# Patient Record
Sex: Female | Born: 1960 | Race: White | Hispanic: No | Smoking: Current every day smoker
Health system: Southern US, Community
[De-identification: ages and names within clinical notes are randomized; demographics above are authoritative.]

## PROBLEM LIST (undated history)

## (undated) DIAGNOSIS — E785 Hyperlipidemia, unspecified: Secondary | ICD-10-CM

## (undated) DIAGNOSIS — I10 Essential (primary) hypertension: Secondary | ICD-10-CM

## (undated) HISTORY — DX: Essential (primary) hypertension: I10

## (undated) HISTORY — DX: Hyperlipidemia, unspecified: E78.5

## (undated) HISTORY — PX: SPINE SURGERY: SHX786

## (undated) HISTORY — PX: ANKLE SURGERY: SHX546

## (undated) HISTORY — PX: TONSILLECTOMY AND ADENOIDECTOMY: SHX28

---

## 2020-04-07 DIAGNOSIS — R55 Syncope and collapse: Secondary | ICD-10-CM

## 2020-04-07 HISTORY — DX: Syncope and collapse: R55

## 2020-05-08 HISTORY — PX: OTHER SURGICAL HISTORY: SHX169

## 2020-05-19 HISTORY — PX: TRANSTHORACIC ECHOCARDIOGRAM: SHX275

## 2020-09-03 ENCOUNTER — Encounter: Payer: Self-pay | Admitting: Cardiology

## 2020-09-03 ENCOUNTER — Other Ambulatory Visit: Payer: Self-pay

## 2020-09-03 ENCOUNTER — Ambulatory Visit (INDEPENDENT_AMBULATORY_CARE_PROVIDER_SITE_OTHER): Payer: BC Managed Care – PPO | Admitting: Cardiology

## 2020-09-03 VITALS — BP 110/62 | HR 102 | Ht 66.0 in | Wt 172.8 lb

## 2020-09-03 DIAGNOSIS — F17210 Nicotine dependence, cigarettes, uncomplicated: Secondary | ICD-10-CM

## 2020-09-03 DIAGNOSIS — Z87898 Personal history of other specified conditions: Secondary | ICD-10-CM

## 2020-09-03 DIAGNOSIS — Z8249 Family history of ischemic heart disease and other diseases of the circulatory system: Secondary | ICD-10-CM | POA: Diagnosis not present

## 2020-09-03 DIAGNOSIS — E785 Hyperlipidemia, unspecified: Secondary | ICD-10-CM | POA: Diagnosis not present

## 2020-09-03 DIAGNOSIS — I10 Essential (primary) hypertension: Secondary | ICD-10-CM | POA: Diagnosis not present

## 2020-09-03 DIAGNOSIS — R000 Tachycardia, unspecified: Secondary | ICD-10-CM | POA: Insufficient documentation

## 2020-09-03 NOTE — Patient Instructions (Addendum)
Medication Instructions:  No  changes  *If you need a refill on your cardiac medications before your next appointment, please call your pharmacy*   Lab Work: Okay to check at [place] office. Lipid panel  Comprehensive metabolic panel  TSH  Hemoglobin A1c  EKG 12-Lead    If you have labs (blood work) drawn today and your tests are completely normal, you will receive your results only by: MyChart Message (if you have MyChart) OR A paper copy in the mail If you have any lab test that is abnormal or we need to change your treatment, we will call you to review the results.   Testing/Procedures:CT coronary calcium score. This test is done at [facility]. This is $99 out of pocket.   Coronary CalciumScan A coronary calcium scan is an imaging test used to look for deposits of calcium and other fatty materials (plaques) in the inner lining of the blood vessels of the heart (coronary arteries). These deposits of calcium and plaques can partly clog and narrow the coronary arteries without producing any symptoms or warning signs. This puts a person at risk for a heart attack. This test can detect these deposits before symptoms develop. Tell a health care provider about: Any allergies you have. All medicines you are taking, including vitamins, herbs, eye drops, creams, and over-the-counter medicines. Any problems you or family members have had with anesthetic medicines. Any blood disorders you have. Any surgeries you have had. Any medical conditions you have. Whether you are pregnant or may be pregnant. What are the risks? Generally, this is a safe procedure. However, problems may occur, including: Harm to a pregnant woman and her unborn baby. This test involves the use of radiation. Radiation exposure can be dangerous to a pregnant woman and her unborn baby. If you are pregnant, you generally should not have this procedure done. Slight increase in the risk of cancer. This is  because of the radiation involved in the test. What happens before the procedure? No preparation is needed for this procedure. What happens during the procedure? You will undress and remove any jewelry around your neck or chest. You will put on a hospital gown. Sticky electrodes will be placed on your chest. The electrodes will be connected to an electrocardiogram (ECG) machine to record a tracing of the electrical activity of your heart. A CT scanner will take pictures of your heart. During this time, you will be asked to lie still and hold your breath for 2-3 seconds while a picture of your heart is being taken. The procedure may vary among health care providers and hospitals. What happens after the procedure? You can get dressed. You can return to your normal activities. It is up to you to get the results of your test. Ask your health care provider, or the department that is doing the test, when your results will be ready. Summary A coronary calcium scan is an imaging test used to look for deposits of calcium and other fatty materials (plaques) in the inner lining of the blood vessels of the heart (coronary arteries). Generally, this is a safe procedure. Tell your health care provider if you are pregnant or may be pregnant. No preparation is needed for this procedure. A CT scanner will take pictures of your heart. You can return to your normal activities after the scan is done. This information is not intended to replace advice given to you by your health care provider. Make sure you discuss any questions you have  with your health care provider. Document Released: 07/23/2007 Document Revised: 12/14/2015 Document Reviewed: 12/14/2015 Elsevier Interactive Patient Education  2017 [employer].    Follow-Up: At [facility], you and your health needs are our priority.  As part of our continuing mission to provide you with exceptional heart care, we have created designated Provider Care  Teams.  These Care Teams include your primary Cardiologist (physician) and Advanced Practice Providers (APPs -  Physician Assistants and Nurse Practitioners) who all work together to provide you with the care you need, when you need it.  We recommend signing up for the patient portal called "MyChart".  Sign up information is provided on this After Visit Summary.  MyChart is used to connect with patients for Virtual Visits (Telemedicine).  Patients are able to view lab/test results, encounter notes, upcoming appointments, etc.  Non-urgent messages can be sent to your provider as well.   To learn more about what you can do with MyChart, go to [email or web address].    Your next appointment:    1 to 2 month(s)  The format for your next appointment:   In person  or virtual   Provider:   Bryan Lemma, MD

## 2020-09-03 NOTE — Progress Notes (Signed)
Primary Care Provider: Pcp, No . Stambaugh, Jillyn Hidden, MD; [facility] [place]. Alford Highland, [place] Cardiologist: Glenetta Hew, MD Previously seen by: Lorella Nimrod, MD, [facility];  [facility]) Hypertrophic [place] [facility] Electrophysiologist: None  Clinic Note: No chief complaint on file.  ===================================  ASSESSMENT/PLAN   Problem List Items Addressed This Visit       Cardiology Problems   Essential hypertension (Chronic)    Blood pressure actually seems very well controlled today.  She is on an unusual dosing interval of 20 mg Benicar twice daily along with low-dose HCTZ 12.5 mg.  For now, I am inclined to leave meds alone pending results of her Coronary Calcium Score.  Eventually, I would consider reducing ARB dose to allow use of a beta-blocker (likely beta-1 selective) because of the sinus tachycardia.       Relevant Medications   hydrochlorothiazide (MICROZIDE) 12.5 MG capsule   olmesartan (BENICAR) 20 MG tablet   Hyperlipidemia with target LDL less than 100 (Chronic)    With hypertension and significant family history, I would suggest at least an LDL of less than 100.  She has not had labs checked since August 2021 which were poorly controlled.  Total cholesterol was 273 with an LDL of 193.  She is not currently on any medications.  Plan: Check FLP and CMP for new baseline Coronary Calcium Score for baseline cardiovascular risk -> this will help [place] determine how aggressive we need to be with management.      Relevant Medications   hydrochlorothiazide (MICROZIDE) 12.5 MG capsule   olmesartan (BENICAR) 20 MG tablet   Other Relevant Orders   Lipid panel   Comprehensive metabolic panel   Hemoglobin A1c     Other   Heavy cigarette smoker (Chronic)    I talked to her about the risk of smoking, very poorly controlled lipids along with hypertension and the patient and his family  history is pretty significant for CAD.  Both parents were smokers and both had MIs in their mid 48s to low 60s.  Smoking cessation instruction/counseling given:  counseled patient on the dangers of tobacco use, advised patient to stop smoking, and reviewed strategies to maximize success - 5 min       Relevant Orders   CT CARDIAC SCORING (SELF PAY ONLY)   EKG 12-Lead (Completed)   Sinus tachycardia (Chronic)    Heart rate is 102.  Not sure if this is simply because of anxiety.  She says her heart rate tends to run fast.  We will see what it is on her follow-up visit.  If it remains elevated, would potentially consider adding beta-blocker which would mean we need to reduce ARB dose.       Relevant Orders   CT CARDIAC SCORING (SELF PAY ONLY)   TSH   EKG 12-Lead (Completed)   History of syncope (Chronic)    She had a clear syncopal episode back in [place], but I am not sure if we may ever accurately diagnose what happened.  She was tachycardic in the ER, but unfortunately we do not have any documentation of what her rate and rhythm were at the time of her episode.  Heart rates in the 120s to 130s would not cause syncope, and did not sound consistent with SVT  I think if she has another episode, would consider loop recorder. I have not seen that but she has had a neurologic evaluation with EEG  Relevant Orders   TSH   EKG 12-Lead (Completed)   Family history of premature coronary artery disease - Primary (Chronic)    Both parents had CAD relatively early.  She herself has poorly controlled lipids based on August 2021 evaluation and has had hypertension.  She is a long-term smoker herself.  Relatively high risk.  Thankfully, she is asymptomatic with no active chest pain or pressure with rest or exertion.  No heart failure symptoms.  Plan: Risk Stratification with Coronary Calcium Score -> we will base our lipid management goals from this result. If significantly elevated, may  consider ischemic evaluation since she is relatively sedentary. Will go ahead and check a lipid panel as well as CBC, A1c and TSH for baseline evaluation.  (This can be checked in [place], in order for her to avoid 20+ minutes more driving.)       Relevant Orders   CT CARDIAC SCORING (SELF PAY ONLY)   Lipid panel   Comprehensive metabolic panel   Hemoglobin A1c   ===================================  HPI:    Armida Wixon is a 60 y.o. female with a PMH notable for hypertension and history of syncope who is being seen today for the evaluation of SYNCOPE, HYPERTENSION & FAMILY H/O PREMATURE CAD at the request of Bland Span, FNP.  Recent Hospitalizations:  04/30/2020 - [facility] for Fall/ Syncope - Facial & Rib pain Went to Urgent Care & sent to ER - HR noted to be ~120-130 bpm (? Rhythm).  Normal labs - despite Rib & facial Fxr - declined admission -> Left AMA CXR - ? Non-displaced 7th Rib Fxr.  Maxillofacial CT scan: revealed an acute fracture of the left orbital floor. An acute fracture of the lateral wall of the left orbit. Acute fractures of the anterior and posterior lateral walls of the left maxillary sinus. She was also noted to have bilateral maxillary sinusitis.  CT of the head was negative for acute intracranial hemorrhage  She noted that she woke up at 4 AM to go to the bathroom, and on her way back she apparently passed out.  Her boyfriend heard her fall and hit the floor-checked in on her and noted that she was not responsive.  She came back around after a few minutes but was still confused.  Took 10 to 15 minutes for her to become more like baseline.  No witnessed seizure-like activity (tonic-clonic movements, bladder incontinence or tongue biting). No associated irregular heartbeats or palpitations.  No dizziness or prior syncopal episodes.  No near syncope.  After the fall she went to bed but noted left sided jaw and rib pain from the fall.  Shadawn Tagliaferro was  seen by  on 05/07/2020 by Ms. Alford Highland following her ER visit for an episode of syncope.  The only thing she really complained about wasLeft-sided facial and rib pain.  She had not had any further episodes of lightheadedness dizziness or syncope. She ordered an echo and 48-hour Holter monitor -> See below She also ordered an EEG to rule out seizures, along with carotid Dopplers (these have not been done)  -She was seen in follow-up on April 14, noted to be hypertensive and was started on olmesartan. --> Apparently Kemya has been very upset about lack of responsiveness from either her primary provider cardiology managing hypertension.  She was calling about elevated pressures and felt like she was not getting a response.  However medications were adjusted based on telephone calls and she is now on  TID 20 mg losartan and HCTZ   Reviewed  CV studies:    The following studies were reviewed today: (if available, images/films reviewed: From Epic Chart or Care Everywhere) 48-hour Holter monitor April 2022: [facility]:  Sinus rhythm was the predominant rhythm. Heart rate ranges from 65 to 131 bpm average 84 bpm. The longest R-R interval was 1.1 seconds. Only rare (<0.01%) supraventricular ectopies recorded No ventricular Ectopics recorded. No pauses of 3 seconds or longer were recorded. No recorded atrial fibrillation or atrial flutter. Diary symptoms noted: None TTE 05/19/2020: ([facility], [place]) normal LVEF 60 to 65%.  No R WMA.  GR 1 DD with elevated LVEDP..  Mild MAC.  Otherwise normal valves.  Interval History:   Kabrina Pietrzak presents here today presumably to establish cardiology care as she "Does not trust " the cardiologist in her area.  She is not happy with how her blood pressure to manage.  She says that she is being very upset because her blood pressures were quite elevated.  She was monitoring her pressures at home with aBlood pressure log and had pressure ranges in  the 150-160/90s.  Despite having elevated blood pressures, she was not really having any symptoms of swelling, chest pain, dyspnea, PND or orthopnea.  No headache or dizziness. Really, since her syncopal episode in [place], she has not had any further episodes of dizziness, wooziness or acute syncope/near syncope.  She has not any orthostatic symptoms.    She feels as though her care providers have not been "attentive enough.  She is very concerned because of her family history of hypertension and relatively early age CAD.  She is in between the ages but her father and mother were when they suffered MIs.  Today she tells me that she is not very active, but does not note any resting exertional chest tightness or pressure.  No real noticeable change in her exertional dyspnea from smoking.  She is trying to now get a little more exercise trying to do some walking about 2 times a week.  Other than occasional twinging pain from her rib fracture, she does not noticed any chest tightness or pressure.  She has not had any further syncopal episodes.  She is never noted any rapid or irregular heartbeat/palpitations. She says her heart rate is always fast, but she does not really notice it.  She thinks maybe little anxious today so it is higher than usual.   CV Review of Symptoms (Summary) Cardiovascular ROS: positive for - dyspnea on exertion and some Chest wall pain from her rib fracture negative for - chest pain, edema, irregular heartbeat, orthopnea, palpitations, paroxysmal nocturnal dyspnea, rapid heart rate, or Further episodes of syncope/near syncope.  No TIA or amaurosis fugax.  Not walking enough to notice any claudication.  REVIEWED OF SYSTEMS   Review of Systems  Constitutional:  Negative for malaise/fatigue (Not very full of energy, but no fatigue.) and weight loss.  HENT:  Negative for congestion and nosebleeds.   Respiratory:  Positive for cough (chronic smoker's cough) and shortness of breath  (baseline).   Cardiovascular:  Negative for leg swelling.  Gastrointestinal:  Negative for blood in stool, constipation, diarrhea and melena.  Genitourinary:  Negative for dysuria and hematuria.  Musculoskeletal:  Positive for joint pain. Negative for falls.       Still has occasional chest wall pain related to Rib Fxr. Jaw seems to be doing better.  Neurological:  Negative for dizziness, focal weakness and weakness.  Psychiatric/Behavioral:  Negative for depression and memory loss. The patient is nervous/anxious. The patient does not have insomnia.    I have reviewed and (if needed) personally updated the patient's problem list, medications, allergies, past medical and surgical history, social and family history.   PAST MEDICAL HISTORY   Past Medical History:  Diagnosis Date   Essential hypertension    Hyperlipidemia    Syncopal episodes 04/2020   Normal echo and monitor.    PAST SURGICAL HISTORY   Past Surgical History:  Procedure Laterality Date   48-HOUR HOLTER MONITOR  05/2020   [facility]:  Sinus rhythm was the predominant rhythm. Heart rate ranges from 65 to 131 bpm average 84 bpm. The longest R-R interval was 1.1 seconds. Only rare (<0.01%) supraventricular ectopies recorded No ventricular Ectopics recorded. No pauses of 3 seconds or longer were recorded. No recorded atrial fibrillation or atrial flutter. Diary symptoms noted: None   ANKLE SURGERY     CESAREAN SECTION     Has had 4 C-sections   SPINE SURGERY     TONSILLECTOMY AND ADENOIDECTOMY     Childhood   TRANSTHORACIC ECHOCARDIOGRAM  05/19/2020   ([facility], [place]) normal LVEF 60 to 65%.  No R WMA.  GR 1 DD with elevated LVEDP..  Mild MAC.  Otherwise normal valves.    There is no immunization history on file for this patient.  MEDICATIONS/ALLERGIES   Current Meds  Medication Sig   ALPRAZolam (XANAX) 0.25 MG tablet Take 0.25 mg by mouth every 8 (eight) hours as needed.    hydrochlorothiazide (MICROZIDE) 12.5 MG capsule Take 12.5 mg by mouth daily.   olmesartan (BENICAR) 20 MG tablet Take 20 mg by mouth 2 (two) times daily.    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  She does travel somewhat work, but is able to work from home..   She is a divorced mother of 4.  (As of 2022: Ages 1, 31, 48 and 71. 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      She acknowledges that she does not do any routine exercise.      Family History  Problem Relation Age of Onset   Hypertension Mother    Heart attack Mother 81       Unknown details   Coronary artery disease Mother 16   Hypertension Father    Diabetes Mellitus II Father        Was morbidly obese   Heart attack Father 22       Presumably CAD, but not sure.   Coronary artery disease Father 63   Hypertension Sister    Hypertension Brother    Cancer Brother        Pediatric cancer  Lung cancer Maternal Grandmother    Heart disease Maternal Grandfather        Unknown details   Stroke Paternal Grandmother    Cancer - Colon Paternal Grandfather        Colorectal cancer   OBJCTIVE -PE, EKG, labs   Wt Readings from Last 3 Encounters:  09/03/20 172 lb 12.8 oz (78.4 kg)    Physical Exam: BP 110/62   Pulse (!) 102   Ht _0  (1.676 m)   Wt 172 lb 12.8  oz (78.4 kg)   SpO2 95%   BMI 27.89 kg/m  Physical Exam Vitals reviewed.  Constitutional:      General: She is not in acute distress.    Appearance: Normal appearance. She is normal weight. She is not ill-appearing (Well-nourished, well-groomed.) or toxic-appearing.     Comments: Cigarette smell.  HENT:     Head: Normocephalic and atraumatic.  Neck:     Vascular: No carotid bruit or JVD.  Cardiovascular:     Rate and Rhythm: Regular rhythm. Tachycardia present. No extrasystoles are present.    Chest Wall: PMI is not displaced.     Pulses: Decreased pulses (Mildly diminished pedal pulses.  1+).     Heart sounds: Normal heart sounds. No murmur heard.   No friction rub. No gallop.  Pulmonary:     Effort: Pulmonary effort is normal. No respiratory distress.     Comments: Mild interstitial sounds throughout.  But otherwise no rales or rhonchi.  Late expiratory wheezing. Chest:     Chest wall: Tenderness present.  Abdominal:     General: Abdomen is flat. Bowel sounds are normal. There is no distension.     Palpations: Abdomen is soft. There is no mass.     Tenderness: no abdominal tenderness     Comments: No HSM  Musculoskeletal:        General: No swelling. Normal range of motion.     Cervical back: Normal range of motion and neck supple.  Skin:    General: Skin is warm and dry.     Coloration: Skin is not pale.  Neurological:     General: No focal deficit present.     Mental Status: She is alert and oriented to person, place, and time.     Cranial Nerves: No cranial nerve deficit.     Gait: Gait normal.  Psychiatric:        Mood and Affect: Mood normal.        Thought Content: Thought content normal.        Judgment: Judgment normal.     Comments: Somewhat intense, anxious    Adult ECG Report  Rate: 102 ;  Rhythm: sinus tachycardia; otherwise normal axis, intervals and durations.  Narrative Interpretation: Borderline  Recent Labs: Reviewed in care everywhere [facility] Related to [facility] CMP LIPID Component 04/30/20  09/09/19   Sodium 139 140  Potassium 4.6 5.2 High   Chloride -- 103  CO2 -- 31  Urea Nitrogen -- 11  Creatinine 0.8 0.57 Low   Glom Filt Rate, Estimated 82 102   GFR, Estim African American -- 118   Glucose, Bld 98 103 High   Total Protein -- 6.6  Albumin -- 4.2  Calcium 9.4 9.9  Total Bilirubin 0.82 0.7  Alkaline Phosphatase, Serum 68 74  AST 25 14  ALT 26 19  Globulin 2.7 2.4  A/G Ratio -- 1.8  Anion Gap -- 11.2  Osmolality -- 279.05  Bun/Creatinine --  19.3  Triglyceride -- 215 High   Cholesterol -- 273 High   HDL -- 37 Low   LDL -- 193  LDL/HDL Ratio -- 5.2  Total Chol/HDL -- 7.4   Received From: [facility]  Result Received: 09/02/20 11:39   No results found for: CHOL, HDL, LDLCALC, LDLDIRECT, TRIG, CHOLHDL No results found for: CREATININE, BUN, NA, K, CL, CO2 No flowsheet data found.  No results found for: TSH  ==================================================  COVID-19 Education: The signs and symptoms of COVID-19 were discussed with the patient and how to seek care for testing (follow up with PCP or arrange E-visit).    I spent a total of 45 minutes with the patient spent in direct patient consultation.  --> She went through in detail all the episodes of [place] and then her concerns about blood pressure.  We talked about different evaluations we reviewed the results of her echocardiogram together as well as monitor.  I discussed other potential etiologies for syncope.  A good portion of this time was acknowledging her concerns about blood pressure management. I also explained the pathophysiology of atherosclerosis based on her family history of CAD.  She wanted baseline evaluation.  I explained what a coronary calcium score entails.  We also discussed when we would potentially consider stress testing.  Additional time spent with chart review  / charting (studies, outside notes, etc): 33 min -> Several  clinic visits, multiple studies reviewed. Entire medical/social and family history updated Total Time: 78 min  Current medicines are reviewed at length with the patient today.  (+/- concerns) n/a  This visit occurred during the SARS-CoV-2 public health emergency.  Safety protocols were in place, including screening questions prior to the visit, additional usage of staff PPE, and extensive cleaning of exam room while observing appropriate contact time as indicated for disinfecting solutions.  Notice: This dictation was prepared with Dragon dictation along with smaller phrase technology. Any transcriptional errors that result from this process are unintentional and may not be corrected upon review.  Patient Instructions / Medication Changes & Studies & Tests Ordered   Patient Instructions  Medication Instructions:  No  changes  *If you need a refill on your cardiac medications before your next appointment, please call your pharmacy*   Lab Work: Okay to check at [place] office. Lipid panel  Comprehensive metabolic panel  TSH  Hemoglobin A1c  EKG 12-Lead    If you have labs (blood work) drawn today and your tests are completely normal, you will receive your results only by: MyChart Message (if you have MyChart) OR A paper copy in the mail If you have any lab test that is abnormal or we need to change your treatment, we will call you to review the results.   Testing/Procedures:CT coronary calcium score. This test is done at [facility]. This is $99 out of pocket.   Coronary CalciumScan A coronary calcium scan is an imaging test used to look for deposits of calcium and other fatty materials (plaques) in the inner lining of the blood vessels of the heart (coronary arteries). These deposits of calcium and plaques can partly clog and narrow the coronary arteries without producing any symptoms or warning signs. This puts a person at risk for a heart attack. This test can detect  these deposits before symptoms develop. Tell a health care provider about: Any allergies you have. All medicines you are taking, including vitamins, herbs, eye drops, creams, and over-the-counter medicines. Any problems you or family members  have had with anesthetic medicines. Any blood disorders you have. Any surgeries you have had. Any medical conditions you have. Whether you are pregnant or may be pregnant. What are the risks? Generally, this is a safe procedure. However, problems may occur, including: Harm to a pregnant woman and her unborn baby. This test involves the use of radiation. Radiation exposure can be dangerous to a pregnant woman and her unborn baby. If you are pregnant, you generally should not have this procedure done. Slight increase in the risk of cancer. This is because of the radiation involved in the test. What happens before the procedure? No preparation is needed for this procedure. What happens during the procedure? You will undress and remove any jewelry around your neck or chest. You will put on a hospital gown. Sticky electrodes will be placed on your chest. The electrodes will be connected to an electrocardiogram (ECG) machine to record a tracing of the electrical activity of your heart. A CT scanner will take pictures of your heart. During this time, you will be asked to lie still and hold your breath for 2-3 seconds while a picture of your heart is being taken. The procedure may vary among health care providers and hospitals. What happens after the procedure? You can get dressed. You can return to your normal activities. It is up to you to get the results of your test. Ask your health care provider, or the department that is doing the test, when your results will be ready. Summary A coronary calcium scan is an imaging test used to look for deposits of calcium and other fatty materials (plaques) in the inner lining of the blood vessels of the heart (coronary  arteries). Generally, this is a safe procedure. Tell your health care provider if you are pregnant or may be pregnant. No preparation is needed for this procedure. A CT scanner will take pictures of your heart. You can return to your normal activities after the scan is done. This information is not intended to replace advice given to you by your health care provider. Make sure you discuss any questions you have with your health care provider. Document Released: 07/23/2007 Document Revised: 12/14/2015 Document Reviewed: 12/14/2015 Elsevier Interactive Patient Education  2017 [place]: At [facility], you and your health needs are our priority.  As part of our continuing mission to provide you with exceptional heart care, we have created designated Provider Care Teams.  These Care Teams include your primary Cardiologist (physician) and Advanced Practice Providers (APPs -  Physician Assistants and Nurse Practitioners) who all work together to provide you with the care you need, when you need it.  We recommend signing up for the patient portal called "MyChart".  Sign up information is provided on this After Visit Summary.  MyChart is used to connect with patients for Virtual Visits (Telemedicine).  Patients are able to view lab/test results, encounter notes, upcoming appointments, etc.  Non-urgent messages can be sent to your provider as well.   To learn more about what you can do with MyChart, go to [email or web address].    Your next appointment:    1 to 2 month(s)  The format for your next appointment:   In person  or virtual   Provider:   Glenetta Hew, MD       Studies Ordered:   Orders Placed This Encounter  Procedures   CT CARDIAC SCORING (SELF PAY ONLY)   Lipid panel   Comprehensive metabolic panel  TSH   Hemoglobin A1c   EKG 12-Lead      Glenetta Hew, M.D., M.S. Interventional Cardiologist   Pager # [phone] Phone # [phone] [address] [place], [place] [address]   Thank you for choosing Heartcare at [facility]!!

## 2020-09-04 NOTE — Telephone Encounter (Signed)
Can you see if there is something sooner in [place]?

## 2020-09-06 ENCOUNTER — Encounter: Payer: Self-pay | Admitting: Cardiology

## 2020-09-06 NOTE — Assessment & Plan Note (Signed)
Heart rate is 102.  Not sure if this is simply because of anxiety.  She says her heart rate tends to run fast.  We will see what it is on her follow-up visit.  If it remains elevated, would potentially consider adding beta-blocker which would mean we need to reduce ARB dose.

## 2020-09-06 NOTE — Assessment & Plan Note (Signed)
She had a clear syncopal episode back in [place], but I am not sure if we may ever accurately diagnose what happened.  She was tachycardic in the ER, but unfortunately we do not have any documentation of what her rate and rhythm were at the time of her episode.  Heart rates in the 120s to 130s would not cause syncope, and did not sound consistent with SVT  I think if she has another episode, would consider loop recorder. I have not seen that but she has had a neurologic evaluation with EEG

## 2020-09-06 NOTE — Assessment & Plan Note (Signed)
With hypertension and significant family history, I would suggest at least an LDL of less than 100.  She has not had labs checked since August 2021 which were poorly controlled.  Total cholesterol was 273 with an LDL of 193.  She is not currently on any medications.  Plan:  Check FLP and CMP for new baseline  Coronary Calcium Score for baseline cardiovascular risk -> this will help [place] determine how aggressive we need to be with management.

## 2020-09-06 NOTE — Assessment & Plan Note (Signed)
I talked to her about the risk of smoking, very poorly controlled lipids along with hypertension and the patient and his family history is pretty significant for CAD.  Both parents were smokers and both had MIs in their mid 55s to low 60s.  Smoking cessation instruction/counseling given:  counseled patient on the dangers of tobacco use, advised patient to stop smoking, and reviewed strategies to maximize success - 5 min

## 2020-09-06 NOTE — Assessment & Plan Note (Signed)
Both parents had CAD relatively early.  She herself has poorly controlled lipids based on August 2021 evaluation and has had hypertension.  She is a long-term smoker herself.  Relatively high risk.  Thankfully, she is asymptomatic with no active chest pain or pressure with rest or exertion.  No heart failure symptoms.  Plan:  Risk Stratification with Coronary Calcium Score -> we will base our lipid management goals from this result.  If significantly elevated, may consider ischemic evaluation since she is relatively sedentary.  Will go ahead and check a lipid panel as well as CBC, A1c and TSH for baseline evaluation.  (This can be checked in [place], in order for her to avoid 20+ minutes more driving.)

## 2020-09-06 NOTE — Assessment & Plan Note (Signed)
Blood pressure actually seems very well controlled today.  She is on an unusual dosing interval of 20 mg Benicar twice daily along with low-dose HCTZ 12.5 mg.  For now, I am inclined to leave meds alone pending results of her Coronary Calcium Score.  Eventually, I would consider reducing ARB dose to allow use of a beta-blocker (likely beta-1 selective) because of the sinus tachycardia.

## 2020-09-23 ENCOUNTER — Ambulatory Visit (INDEPENDENT_AMBULATORY_CARE_PROVIDER_SITE_OTHER)
Admission: RE | Admit: 2020-09-23 | Discharge: 2020-09-23 | Disposition: A | Discharge status: Home / Self Care | Payer: Self-pay | Source: Ambulatory Visit | Attending: Cardiology | Admitting: Cardiology

## 2020-09-23 ENCOUNTER — Other Ambulatory Visit: Payer: Self-pay

## 2020-09-23 DIAGNOSIS — R000 Tachycardia, unspecified: Secondary | ICD-10-CM

## 2020-09-23 DIAGNOSIS — Z8249 Family history of ischemic heart disease and other diseases of the circulatory system: Secondary | ICD-10-CM

## 2020-09-23 DIAGNOSIS — F17210 Nicotine dependence, cigarettes, uncomplicated: Secondary | ICD-10-CM

## 2020-09-23 DIAGNOSIS — I2510 Atherosclerotic heart disease of native coronary artery without angina pectoris: Secondary | ICD-10-CM

## 2020-09-23 HISTORY — DX: Atherosclerotic heart disease of native coronary artery without angina pectoris: I25.10

## 2020-09-24 LAB — LIPID PANEL
Chol/HDL Ratio: 8.1 ratio — ABNORMAL HIGH (ref 0.0–4.4)
Cholesterol, Total: 307 mg/dL — ABNORMAL HIGH (ref 100–199)
HDL: 38 mg/dL — ABNORMAL LOW (ref >39)
LDL Chol Calc (NIH): 229 mg/dL — ABNORMAL HIGH (ref 0–99)
Triglycerides: 203 mg/dL — ABNORMAL HIGH (ref 0–149)
VLDL Cholesterol Cal: 40 mg/dL (ref 5–40)

## 2020-09-24 LAB — COMPREHENSIVE METABOLIC PANEL
ALT: 17 IU/L (ref 0–32)
AST: 18 IU/L (ref 0–40)
Albumin/Globulin Ratio: 2.3 — ABNORMAL HIGH (ref 1.2–2.2)
Albumin: 4.8 g/dL (ref 3.8–4.9)
Alkaline Phosphatase: 78 IU/L (ref 44–121)
BUN/Creatinine Ratio: 22 (ref 9–23)
BUN: 15 mg/dL (ref 6–24)
Bilirubin Total: 0.6 mg/dL (ref 0.0–1.2)
CO2: 22 mmol/L (ref 20–29)
Calcium: 9.8 mg/dL (ref 8.7–10.2)
Chloride: 98 mmol/L (ref 96–106)
Creatinine, Ser: 0.69 mg/dL (ref 0.57–1.00)
Globulin, Total: 2.1 g/dL (ref 1.5–4.5)
Glucose: 111 mg/dL — ABNORMAL HIGH (ref 65–99)
Potassium: 4.5 mmol/L (ref 3.5–5.2)
Sodium: 139 mmol/L (ref 134–144)
Total Protein: 6.9 g/dL (ref 6.0–8.5)
eGFR: 100 mL/min/{1.73_m2} (ref >59)

## 2020-09-24 LAB — TSH: TSH: 1.57 u[IU]/mL (ref 0.450–4.500)

## 2020-09-24 LAB — HEMOGLOBIN A1C
Est. average glucose Bld gHb Est-mCnc: 123 mg/dL
Hgb A1c MFr Bld: 5.9 % — ABNORMAL HIGH (ref 4.8–5.6)

## 2020-09-25 ENCOUNTER — Telehealth: Payer: Self-pay | Admitting: *Deleted

## 2020-09-25 DIAGNOSIS — E785 Hyperlipidemia, unspecified: Secondary | ICD-10-CM

## 2020-09-25 DIAGNOSIS — R931 Abnormal findings on diagnostic imaging of heart and coronary circulation: Secondary | ICD-10-CM

## 2020-09-25 DIAGNOSIS — Z8249 Family history of ischemic heart disease and other diseases of the circulatory system: Secondary | ICD-10-CM

## 2020-09-25 DIAGNOSIS — F17210 Nicotine dependence, cigarettes, uncomplicated: Secondary | ICD-10-CM

## 2020-09-25 MED ORDER — ROSUVASTATIN CALCIUM 20 MG PO TABS: 20.0000 mg | ORAL_TABLET | Freq: Every day | ORAL | 3 refills | Status: DC

## 2020-09-25 NOTE — Telephone Encounter (Signed)
Order placed for rosuvastatin 20 mg  and lab work

## 2020-09-25 NOTE — Telephone Encounter (Signed)
-----   Message from Mihai Croitoru, MD sent at 09/24/2020 12:52 PM EDT ----- Cholesterol is very high (even higher than 2021) and needs to be treated with medication, in addition to healthy diet and exercise. LDL 229, target <70 (need a 70% reduction). Start rosuvastatin 20 mg daily please. Recheck lipids 3 months. Routine labs and thyroid are OK, glucose and A1c in prediabetes range. 

## 2020-09-25 NOTE — Telephone Encounter (Signed)
-----   Message from Thurmon Fair, MD sent at 09/24/2020 12:52 PM EDT ----- Cholesterol is very high (even higher than 2021) and needs to be treated with medication, in addition to healthy diet and exercise. LDL 229, target <70 (need a 70% reduction). Start rosuvastatin 20 mg daily please. Recheck lipids 3 months. Routine labs and thyroid are OK, glucose and A1c in prediabetes range.

## 2020-09-25 NOTE — Telephone Encounter (Signed)
Patient reviewed results of labs and cardiac scoring via mychart.  RN  called Patient to go over details with results. Patient is in agreement to start rosuvastatin  and schedule  exercise myoview.  Order placed

## 2020-09-25 NOTE — Telephone Encounter (Signed)
-----   Message from Thurmon Fair, MD sent at 09/23/2020 12:54 PM EDT ----- Calcium score is very high, suggesting increased likelihood of blockages. Important to keep cholesterol low (LDL<70, updated labs pending) and quit smoking permanently. Per August 2021 labs with LDL 193, needs >60% reduction in LDL. Recommend rosuvastatin 20 mg daily after we draw her updated labs. Also needs a functional evaluation. The high resting heart rate and the high calcium score could make coronary CTA harder to interpret. Recommend a treadmill Myoview if able to exercise (Lexiscan if not able).

## 2020-09-30 ENCOUNTER — Telehealth (HOSPITAL_COMMUNITY): Payer: Self-pay | Admitting: *Deleted

## 2020-09-30 NOTE — Telephone Encounter (Signed)
Close encounter 

## 2020-10-01 ENCOUNTER — Ambulatory Visit (HOSPITAL_COMMUNITY)
Admission: RE | Admit: 2020-10-01 | Discharge: 2020-10-01 | Disposition: A | Discharge status: Home / Self Care | Payer: BC Managed Care – PPO | Source: Ambulatory Visit | Attending: Cardiology | Admitting: Cardiology

## 2020-10-01 ENCOUNTER — Ambulatory Visit (HOSPITAL_COMMUNITY): Payer: BC Managed Care – PPO

## 2020-10-01 ENCOUNTER — Other Ambulatory Visit: Payer: Self-pay

## 2020-10-01 DIAGNOSIS — Z8249 Family history of ischemic heart disease and other diseases of the circulatory system: Secondary | ICD-10-CM | POA: Insufficient documentation

## 2020-10-01 DIAGNOSIS — R931 Abnormal findings on diagnostic imaging of heart and coronary circulation: Secondary | ICD-10-CM | POA: Insufficient documentation

## 2020-10-01 DIAGNOSIS — F17210 Nicotine dependence, cigarettes, uncomplicated: Secondary | ICD-10-CM | POA: Insufficient documentation

## 2020-10-01 DIAGNOSIS — Z79899 Other long term (current) drug therapy: Secondary | ICD-10-CM | POA: Diagnosis not present

## 2020-10-01 HISTORY — PX: NM MYOVIEW LTD: HXRAD82

## 2020-10-01 LAB — MYOCARDIAL PERFUSION IMAGING
Estimated workload: 6.4
Exercise duration (min): 5 min
Exercise duration (sec): 30 s
LV dias vol: 68 mL (ref 46–106)
LV sys vol: 27 mL
MPHR: 161 {beats}/min
Nuc Stress EF: 61 %
Peak HR: 162 {beats}/min
Percent HR: 100 %
Rest HR: 72 {beats}/min
Rest Nuclear Isotope Dose: 10.8 mCi
SDS: 1
SRS: 0
SSS: 1
ST Depression (mm): 0 mm
Stress Nuclear Isotope Dose: 31.1 mCi
TID: 1.07

## 2020-10-01 MED ORDER — TECHNETIUM TC 99M TETROFOSMIN IV KIT
31.1000 | PACK | Freq: Once | INTRAVENOUS | Status: AC | PRN
  Administered 2020-10-01: 31.1 via INTRAVENOUS
  Filled 2020-10-01: qty 32

## 2020-10-01 MED ORDER — TECHNETIUM TC 99M TETROFOSMIN IV KIT
10.8000 | PACK | Freq: Once | INTRAVENOUS | Status: AC | PRN
  Administered 2020-10-01: 10.8 via INTRAVENOUS
  Filled 2020-10-01: qty 11

## 2020-10-16 ENCOUNTER — Telehealth (INDEPENDENT_AMBULATORY_CARE_PROVIDER_SITE_OTHER): Payer: BC Managed Care – PPO | Admitting: Cardiology

## 2020-10-16 VITALS — BP 126/77 | HR 90 | Ht 66.0 in | Wt 170.0 lb

## 2020-10-16 DIAGNOSIS — E785 Hyperlipidemia, unspecified: Secondary | ICD-10-CM

## 2020-10-16 DIAGNOSIS — R000 Tachycardia, unspecified: Secondary | ICD-10-CM

## 2020-10-16 DIAGNOSIS — I2510 Atherosclerotic heart disease of native coronary artery without angina pectoris: Secondary | ICD-10-CM

## 2020-10-16 DIAGNOSIS — F17210 Nicotine dependence, cigarettes, uncomplicated: Secondary | ICD-10-CM

## 2020-10-16 DIAGNOSIS — I10 Essential (primary) hypertension: Secondary | ICD-10-CM

## 2020-10-16 DIAGNOSIS — Z8249 Family history of ischemic heart disease and other diseases of the circulatory system: Secondary | ICD-10-CM | POA: Diagnosis not present

## 2020-10-16 MED ORDER — ROSUVASTATIN CALCIUM 40 MG PO TABS: 40.0000 mg | ORAL_TABLET | Freq: Every day | ORAL | 3 refills | Status: DC

## 2020-10-16 MED ORDER — BUPROPION HCL ER (SR) 150 MG PO TB12: 150.0000 mg | ORAL_TABLET | Freq: Two times a day (BID) | ORAL | 6 refills | Status: DC

## 2020-10-16 MED ORDER — METOPROLOL TARTRATE 25 MG PO TABS: 25.0000 mg | ORAL_TABLET | Freq: Two times a day (BID) | ORAL | 3 refills | Status: DC

## 2020-10-16 NOTE — Patient Instructions (Addendum)
Medication Instructions:    Increase 40 mg Rosuvastatin  at bedtime  Stop taking  HCTZ ( hydrochlorothiazide)  Start taking Metoprolol tartrate 25 mg  one tablet twice a day   May use Wellbutrin 150 mg  twice a day  for smoking cessation ( do take the  second dose of the day late afternoon/early evening before 6 pm )   Once you are ready to quit smoking  you can use  Nicoderm  14 mg patch - may purchase over the continue.  *If you need a refill on your cardiac medications before your next appointment, please call your pharmacy*   Lab Work: Lipid  CMP -- use labslip that have been mailed  to you sometime in Dec /Jan 2023  If you have labs (blood work) drawn today and your tests are completely normal, you will receive your results only by: MyChart Message (if you have MyChart) OR A paper copy in the mail If you have any lab test that is abnormal or we need to change your treatment, we will call you to review the results.   Testing/Procedures:  Not needed   Follow-Up: At [facility], you and your health needs are our priority.  As part of our continuing mission to provide you with exceptional heart care, we have created designated Provider Care Teams.  These Care Teams include your primary Cardiologist (physician) and Advanced Practice Providers (APPs -  Physician Assistants and Nurse Practitioners) who all work together to provide you with the care you need, when you need it.     Your next appointment:   5 month(s)  The format for your next appointment:   In Person  Provider:   Bryan Lemma, MD   Other Instructions

## 2020-10-16 NOTE — Progress Notes (Signed)
Virtual Visit via Telephone Note   This visit type was conducted due to national recommendations for restrictions regarding the COVID-19 Pandemic (e.g. social distancing) in an effort to limit this patient's exposure and mitigate transmission in our community.  Due to her co-morbid illnesses, this patient is at least at moderate risk for complications without adequate follow up.  This format is felt to be most appropriate for this patient at this time.  The patient did not have access to video technology/had technical difficulties with video requiring transitioning to audio format only (telephone).  All issues noted in this document were discussed and addressed.  No physical exam could be performed with this format.  Please refer to the patient's chart for her  consent to telehealth for [facility].   Patient has given verbal permission to conduct this visit via virtual appointment and to bill insurance 10/18/2020 3:40 PM     Evaluation Performed:  Follow-up visit  Date:  10/18/2020   ID:  Jasmine Hatfield, DOB 1960/06/28, MRN [id number]  Patient Location: Home Provider Location: Office/Clinic  PCP:  Pcp, No ; Stambaugh, Merris Loni Muse, MD; Chauncey Reading. Alford Highland, [place] Cardiologist:  Glenetta Hew, MD  Electrophysiologist:  None   Chief Complaint:   Chief Complaint  Patient presents with   Follow-up    Test results: Coronary calcium score and Myoview   Coronary Artery Disease    Three-vessel disease noted on coronary CTA.  Mild exertional dyspnea but no angina.   Nicotine Dependence    Asking for help with smoking cessation.    ====================================  ASSESSMENT & PLAN:    Problem List Items Addressed This Visit       Cardiology Problems   Coronary artery disease, non-occlusive - Primary (Chronic)    Severe three-vessel calcification noted on coronary calcium score with a calcium score greater than 1600.  Thankfully, Myoview does not show any signs of ischemia and she is not  actively having any anginal symptoms.  Exertional dyspnea is clearly multifactorial with her long-term smoking, and generalized inactivity but also potentially microvascular ischemia.  Plan: Continue aspirin Increase rosuvastatin to 40 mg DC HCTZ in order to start metoprolol tartrate 25 mg twice daily Smoking cessation counseling-prescribing Wellbutrin 150 mg twice daily recommended 40 mg NicoDerm patch Plan to start Wellbutrin now and attempt smoking cessation as New Year's resolution.      Relevant Medications   rosuvastatin (CRESTOR) 40 MG tablet   metoprolol tartrate (LOPRESSOR) 25 MG tablet   Hyperlipidemia with target LDL less than 70 (Chronic)    Lipid panel looks very concerned with an LDL of 229.  I suspect they will need more than a statin to get this under control.  For now we will start with rosuvastatin 40 mg.  Recheck labs in ~3 months.  Low threshold for referral to Cardiometabolic/Lipid Clinic (As Part of Cardiovascular Risk Reduction Clinic-CVRR)  Plan: Increase rosuvastatin to 40 mg.      Relevant Medications   rosuvastatin (CRESTOR) 40 MG tablet   metoprolol tartrate (LOPRESSOR) 25 MG tablet   Essential hypertension (Chronic)    Blood pressure seems controlled, but she still has tachycardia.  With her having clear evidence of CAD, we need to have a beta-blocker on board.  Plan:   Start metoprolol tartrate 25 mg twice daily. Stop HCTZ and Continue current dose of ARB (Benicar 20 mg)      Relevant Medications   rosuvastatin (CRESTOR) 40 MG tablet   metoprolol tartrate (LOPRESSOR) 25 MG tablet  Other   Heavy cigarette smoker (Chronic)    With her significant CAD risk factors of hypertension, smoking, family history and very poorly controlled lipids, she really needs to stop smoking.  We actually spent about 10 minutes talking about smoking cessation.  She has not done well with Chantix in the past.  1 time at work, but the other time it did not help at  all. She is also reluctant in contacting a quit line-stating that when she tried it in [place], they only made things worse.  Plan: She wants to establish a quit date, but needs to wait till she gets back from her job assignment that takes her back to [place]. Quit date will be as a New Year's resolution Will initiate Wellbutrin 150 mg twice daily now and provide refills through mid year next year Recommend that she use 19m NicoDerm patches Additional counseling performed.      Sinus tachycardia (Chronic)    Heart rate a little better today.  Still tends to be in the 90s at least.  Plan: With known CAD now will initiate beta-blocker.   Start off with Lopressor 25 mg twice daily (and DC HCTZ to allow BP room). -=> We will hopefully titrate up dose until stable BP and HR.  Then convert to Toprol.      Family history of premature coronary artery disease (Chronic)    Documented three-vessel calcific CAD with coronary cath score 1667 but Myoview negative.  Low threshold for invasive evaluation virtually to have progressive symptoms of dyspnea with exertion or any exertional chest discomfort.  Now on aspirin, increased dose of statin, ARB and beta-blocker per GMDT       ====================================  History of Present Illness:    Jasmine Hatfield is a 60y.o. female with PMH notable for CRFs of HTN, HLD, Family History of Premature CAD with Elevated Coronary Calcium Score who presents via audio/video conferencing for a telehealth visit today as a follow-up visit to discuss results of Coronary Calcium Score along with Myoview.  Jasmine Hatfield was seen on September 03, 2020 for cardiology consultation ostensibly for syncope but also for family history of CAD.  She is a heavy smoker with significantly elevated lipid panel along with hypertension.  She had not had any further episodes of syncope since her episode back in [place].  This has been evaluated with a monitor that was relatively benign.   Echocardiogram was also relatively normal. -> She was very concerned because of her family history of CAD and her risk factors of hypertension and hyperlipidemia as well as smoking. => Coronary calcium score ordered for risk stratification which then done through Myoview being checked.  Hospitalizations:  No further episodes   Recent - Interim CV studies:   The following studies were reviewed today: CT Cardiac Scoring 09/23/2020: Significant Elevated Coronary Calcium Score 1667.  Noted severe Three-Vessel Coronary Artery Calcification.  Perfusion study recommended.  Also noted aortic atherosclerosis and a 5 mm groundglass nodule in left upper lobe.  (Nodule does not warrant follow-up) Myoview 10/01/2020: (Images personally reviewed).  EF 60 to 65%.  Normal perfusion with no evidence of Ischemia or Infarction.  LOW RISK  Inerval History   Jasmine Hatfield is being evaluated today via telephone call.  She was not able to make the drive down to the clinic to be seen in person and does not have ability to do video conferencing.  The main focus of this visit was to discuss the results of her 2  studies noted above.  She was very happy to hear that there was no evidence of ischemia.  She does have some exertional dyspnea (which she contributes to long-term smoking, deconditioning and being sedentary) but has not really had any chest pain or pressure.  She is still trying to get some more exercise being done, but work has been quite taxing.  She actually is about to travel back to [facility] and stay through December to work on important case.  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  She definitely wants to make some lifestyle changes, but says that because of the stress of work and her having to travel, she really wants to wait till she returns home.  She wants to make smoking cessation and New Year's resolution.  She just thinks that she probably would need some help.  In the  past she has tried Chantix but it did not seem to be effective.  She is quite anxious and wonders if something can be prescribed to help with her anxiety.  Cardiovascular ROS: positive for - dyspnea on exertion, rapid heart rate, and less frequent chest wall pain from her injury.  Smoker's cough negative for - edema, irregular heartbeat, orthopnea, palpitations, paroxysmal nocturnal dyspnea, or lightheadedness, dizziness or wooziness, syncope/near syncope or TIA/amaurosis fugax, claudication   ROS:  Please see the history of present illness.     Review of Systems  Constitutional:  Negative for malaise/fatigue (Does not have a lot of energy, but it has tried to pick up her exercise level which is helping) and weight loss.  HENT:  Negative for nosebleeds.   Respiratory:  Positive for cough (Chronic nonproductive smoker's cough), shortness of breath (Pretty much at baseline) and wheezing (Off and on, but not currently).   Cardiovascular:  Negative for chest pain (No longer really having the chest wall pain).  Gastrointestinal:  Negative for blood in stool and melena.  Genitourinary:  Negative for hematuria.  Musculoskeletal:  Positive for joint pain. Negative for falls and myalgias.  Neurological:  Positive for dizziness (Off-and-on, but no near syncope or syncope.).  Psychiatric/Behavioral:  Negative for depression and memory loss. The patient is nervous/anxious and has insomnia.    Past Medical History:  Diagnosis Date   Coronary artery disease due to calcified coronary lesion 09/23/2020   Coronary calcium score 1667 with severe three-vessel coronary calcification noted. ->  Myoview nonischemic with EF 60 to 65%.   Essential hypertension    Hyperlipidemia    Syncopal episodes 04/2020   Normal echo and monitor.   Past Surgical History:  Procedure Laterality Date   48-HOUR HOLTER MONITOR  05/2020   [facility]:  Sinus rhythm was the predominant rhythm. Heart rate ranges  from 65 to 131 bpm average 84 bpm. The longest R-R interval was 1.1 seconds. Only rare (<0.01%) supraventricular ectopies recorded No ventricular Ectopics recorded. No pauses of 3 seconds or longer were recorded. No recorded atrial fibrillation or atrial flutter. Diary symptoms noted: None   ANKLE SURGERY     CESAREAN SECTION     Has had 4 C-sections   NM MYOVIEW LTD  10/01/2020   EF 60 to 65%.  Normal perfusion with no evidence of Ischemia or Infarction.  LOW RISK   SPINE SURGERY     TONSILLECTOMY AND ADENOIDECTOMY     Childhood   TRANSTHORACIC ECHOCARDIOGRAM  05/19/2020   ([facility] [place]) normal LVEF 60 to 65%.  No R WMA.  GR 1 DD with elevated LVEDP..  Mild MAC.  Otherwise normal valves.     Current Meds  Medication Sig   ALPRAZolam (XANAX) 0.25 MG tablet Take 0.25 mg by mouth every 8 (eight) hours as needed. Takes when flying.   metoprolol tartrate (LOPRESSOR) 25 MG tablet Take 1 tablet (25 mg total) by mouth 2 (two) times daily.   olmesartan (BENICAR) 20 MG tablet Take 20 mg by mouth 2 (two) times daily.   rosuvastatin (CRESTOR) 20 MG tablet Take 1 tablet (20 mg total) by mouth daily.   hydrochlorothiazide (MICROZIDE) 12.5 MG capsule[DISCONTINUED today] ( Take 12.5 mg by mouth daily.     Allergies:   Patient has no known allergies.   Social History   Tobacco Use   Smoking status: Every Day    Packs/day: 1.00    Years: 40.00    Pack years: 40.00    Types: Cigarettes   Smokeless tobacco: Never  Substance Use Topics   Alcohol use: Yes    Alcohol/week: 2.0 standard drinks    Types: 2 Glasses of wine per week   Drug use: Never     Family Hx: The patient's family history includes Cancer in her brother; Cancer - Colon in her paternal grandfather; Coronary artery disease (age of onset: 33) in her mother; Coronary artery disease (age of onset: 47) in her father; Diabetes Mellitus II in her father; Heart attack (age of onset: 84) in her mother; Heart attack (age  of onset: 17) in her father; Heart disease in her maternal grandfather; Hypertension in her brother, father, mother, and sister; Lung cancer in her maternal grandmother; Stroke in her paternal grandmother.   Labs/Other Tests and Data Reviewed:    EKG:  No ECG reviewed.  Recent Labs: 09/23/2020: ALT 17; BUN 15; Creatinine, Ser 0.69; Potassium 4.5; Sodium 139; TSH 1.570   Recent Lipid Panel Lab Results  Component Value Date/Time   CHOL 307 (H) 09/23/2020 09:11 AM   TRIG 203 (H) 09/23/2020 09:11 AM   HDL 38 (L) 09/23/2020 09:11 AM   CHOLHDL 8.1 (H) 09/23/2020 09:11 AM   LDLCALC 229 (H) 09/23/2020 09:11 AM    Wt Readings from Last 3 Encounters:  10/16/20 170 lb (77.1 kg)  10/01/20 172 lb (78 kg)  09/03/20 172 lb 12.8 oz (78.4 kg)     Objective:    Vital Signs:  BP 126/77 (BP Location: Left Arm)   Pulse 90   Ht _0  (1.676 m)   Wt 170 lb (77.1 kg)   BMI 27.44 kg/m   VITAL SIGNS:  reviewed GEN:  no acute distress RESPIRATORY:  normal Respiratory effort, no wheezing  NEURO:  A& O x 3.   PSYCH:  normal mood & affect   ==========================================  COVID-19 Education: The signs and symptoms of COVID-19 were discussed with the patient and how to seek care for testing (follow up with PCP or arrange E-visit).   The importance of social distancing was discussed today.  Time:   Today, I have spent 21  minutes with the patient with telehealth technology discussing the above problems.   An additional 39mnutes spent charting (reviewing prior notes, hospital records, studies, labs etc.) Total 31 minutes   Medication Adjustments/Labs and Tests Ordered: Current medicines are reviewed at length with the patient today.  Concerns regarding medicines are outlined above.   Patient Instructions  Medication Instructions:    Increase 40 mg Rosuvastatin  at bedtime  Stop taking  HCTZ ( hydrochlorothiazide)  Start  taking Metoprolol tartrate 25 mg  one tablet twice a  day   May use Wellbutrin 150 mg  twice a day  for smoking cessation ( do take the  second dose of the day late afternoon/early evening before 6 pm )   Once you are ready to quit smoking  you can use  Nicoderm  14 mg patch - may purchase over the continue.  *If you need a refill on your cardiac medications before your next appointment, please call your pharmacy*   Lab Work: Lipid  CMP -- use labslip that have been mailed  to you sometime in Dec /Jan 2023  If you have labs (blood work) drawn today and your tests are completely normal, you will receive your results only by: [place] (if you have MyChart) OR A paper copy in the mail If you have any lab test that is abnormal or we need to change your treatment, we will call you to review the results.   Testing/Procedures:  Not needed   Follow-Up: At [facility], you and your health needs are our priority.  As part of our continuing mission to provide you with exceptional heart care, we have created designated Provider Care Teams.  These Care Teams include your primary Cardiologist (physician) and Advanced Practice Providers (APPs -  Physician Assistants and Nurse Practitioners) who all work together to provide you with the care you need, when you need it.     Your next appointment:   5 month(s)  The format for your next appointment:   In Person  Provider:   Glenetta Hew, MD   Other Instructions    Signed, Glenetta Hew, MD  10/18/2020 3:40 PM    [place]

## 2020-10-18 ENCOUNTER — Encounter: Payer: Self-pay | Admitting: Cardiology

## 2020-10-18 NOTE — Assessment & Plan Note (Signed)
Heart rate a little better today.  Still tends to be in the 90s at least.  Plan: With known CAD now will initiate beta-blocker.    Start off with Lopressor 25 mg twice daily (and DC HCTZ to allow BP room). -=> We will hopefully titrate up dose until stable BP and HR.  Then convert to Toprol.

## 2020-10-18 NOTE — Assessment & Plan Note (Signed)
Severe three-vessel calcification noted on coronary calcium score with a calcium score greater than 1600.  Thankfully, Myoview does not show any signs of ischemia and she is not actively having any anginal symptoms.  Exertional dyspnea is clearly multifactorial with her long-term smoking, and generalized inactivity but also potentially microvascular ischemia.  Plan:  Continue aspirin  Increase rosuvastatin to 40 mg  DC HCTZ in order to start metoprolol tartrate 25 mg twice daily  Smoking cessation counseling-prescribing Wellbutrin 150 mg twice daily recommended 40 mg NicoDerm patch  Plan to start Wellbutrin now and attempt smoking cessation as New Year's resolution.

## 2020-10-18 NOTE — Assessment & Plan Note (Signed)
With her significant CAD risk factors of hypertension, smoking, family history and very poorly controlled lipids, she really needs to stop smoking.  We actually spent about 10 minutes talking about smoking cessation.  She has not done well with Chantix in the past.  1 time at work, but the other time it did not help at all. She is also reluctant in contacting a quit line-stating that when she tried it in [place], they only made things worse.  Plan: She wants to establish a quit date, but needs to wait till she gets back from her job assignment that takes her back to [place].  Quit date will be as a New Year's resolution  Will initiate Wellbutrin 150 mg twice daily now and provide refills through mid year next year  Recommend that she use 14mg  NicoDerm patches  Additional counseling performed.

## 2020-10-18 NOTE — Assessment & Plan Note (Signed)
Documented three-vessel calcific CAD with coronary cath score 1667 but Myoview negative.  Low threshold for invasive evaluation virtually to have progressive symptoms of dyspnea with exertion or any exertional chest discomfort.  Now on aspirin, increased dose of statin, ARB and beta-blocker per GMDT

## 2020-10-18 NOTE — Assessment & Plan Note (Signed)
Lipid panel looks very concerned with an LDL of 229.  I suspect they will need more than a statin to get this under control.  For now we will start with rosuvastatin 40 mg.  Recheck labs in ~3 months.  Low threshold for referral to Cardiometabolic/Lipid Clinic (As Part of Cardiovascular Risk Reduction Clinic-CVRR)  Plan: Increase rosuvastatin to 40 mg.

## 2020-10-18 NOTE — Assessment & Plan Note (Addendum)
Blood pressure seems controlled, but she still has tachycardia.  With her having clear evidence of CAD, we need to have a beta-blocker on board.  Plan:    Start metoprolol tartrate 25 mg twice daily.  Stop HCTZ and  Continue current dose of ARB (Benicar 20 mg)

## 2020-10-29 NOTE — Telephone Encounter (Signed)
Mychart message sent  Good Afternoon,   Do you have a list of your blood pressure reading from each day as well heart rate? I know you have listed a range of the blood pressure.  Once we receive the readings, the information will be sent to Dr Herbie Baltimore to see if would like to increase or add a new medication.  You may take some acetaminophen if need for headache Jasmine Hatfield

## 2020-10-30 NOTE — Telephone Encounter (Signed)
I am not sure which is causing which.  I think maybe the headaches may be causing some distress which increases her blood pressure.  These do not usually blood pressures that would make you have a headache.  I am a little bit concerned about increasing her Benicar to 40 mg if you are taking a lot of ibuprofen. For now, if pressures are elevated (above 150 mmHg), take additional dose of metoprolol 25 mg.  Bryan Lemma, MD

## 2020-11-02 NOTE — Telephone Encounter (Signed)
I takes about 3-5 doses to really notice the full effect.  Bryan Lemma, MD

## 2020-11-06 MED ORDER — METOPROLOL TARTRATE 50 MG PO TABS: 50.0000 mg | ORAL_TABLET | Freq: Two times a day (BID) | ORAL | 3 refills | Status: DC

## 2020-11-06 MED ORDER — HYDROCHLOROTHIAZIDE 12.5 MG PO CAPS: 12.5000 mg | ORAL_CAPSULE | Freq: Every day | ORAL | 3 refills | Status: DC

## 2020-11-06 MED ORDER — OLMESARTAN MEDOXOMIL 20 MG PO TABS: 20.0000 mg | ORAL_TABLET | Freq: Two times a day (BID) | ORAL | 3 refills | Status: DC

## 2020-11-06 NOTE — Addendum Note (Signed)
Addended by: Freddi Starr on: 11/06/2020 01:40 PM   Modules accepted: Orders

## 2020-11-06 NOTE — Addendum Note (Signed)
Addended by: Parke Poisson on: 11/06/2020 01:12 PM   Modules accepted: Orders

## 2020-11-06 NOTE — Telephone Encounter (Signed)
Yes, can restart HCTZ, and lets increase the metoprolol to 2 tablets twice a day (50 mg twice daily)  Bryan Lemma, MD  We can change the prescription and follow-up bottles of metoprolol to 50 mg twice daily

## 2021-01-07 ENCOUNTER — Encounter: Payer: Self-pay | Admitting: Cardiology

## 2021-01-08 NOTE — Telephone Encounter (Signed)
I think anytime this month, ordered in January of this year.  Would be fasting lipid panel and CMP.  Bryan Lemma, MD

## 2021-01-12 ENCOUNTER — Other Ambulatory Visit: Payer: Self-pay | Admitting: *Deleted

## 2021-01-12 DIAGNOSIS — R931 Abnormal findings on diagnostic imaging of heart and coronary circulation: Secondary | ICD-10-CM

## 2021-01-12 DIAGNOSIS — E785 Hyperlipidemia, unspecified: Secondary | ICD-10-CM

## 2021-01-12 NOTE — Telephone Encounter (Signed)
Released labs  so they will be visible to [facility]

## 2021-01-19 LAB — COMPREHENSIVE METABOLIC PANEL
ALT: 39 IU/L — ABNORMAL HIGH (ref 0–32)
AST: 25 IU/L (ref 0–40)
Albumin/Globulin Ratio: 2.1 (ref 1.2–2.2)
Albumin: 4.4 g/dL (ref 3.8–4.9)
Alkaline Phosphatase: 71 IU/L (ref 44–121)
BUN/Creatinine Ratio: 32 — ABNORMAL HIGH (ref 9–23)
BUN: 21 mg/dL (ref 6–24)
Bilirubin Total: 0.5 mg/dL (ref 0.0–1.2)
CO2: 24 mmol/L (ref 20–29)
Calcium: 9.5 mg/dL (ref 8.7–10.2)
Chloride: 103 mmol/L (ref 96–106)
Creatinine, Ser: 0.66 mg/dL (ref 0.57–1.00)
Globulin, Total: 2.1 g/dL (ref 1.5–4.5)
Glucose: 114 mg/dL — ABNORMAL HIGH (ref 70–99)
Potassium: 4.4 mmol/L (ref 3.5–5.2)
Sodium: 141 mmol/L (ref 134–144)
Total Protein: 6.5 g/dL (ref 6.0–8.5)
eGFR: 101 mL/min/{1.73_m2} (ref >59)

## 2021-01-19 LAB — LIPID PANEL
Chol/HDL Ratio: 4 ratio (ref 0.0–4.4)
Cholesterol, Total: 135 mg/dL (ref 100–199)
HDL: 34 mg/dL — ABNORMAL LOW (ref >39)
LDL Chol Calc (NIH): 77 mg/dL (ref 0–99)
Triglycerides: 133 mg/dL (ref 0–149)
VLDL Cholesterol Cal: 24 mg/dL (ref 5–40)

## 2021-06-02 ENCOUNTER — Telehealth: Payer: Self-pay

## 2021-06-02 NOTE — Telephone Encounter (Signed)
Called patient to reschedule appointment. Rescheduled with Jasmine Hatfield  4/27 at 8:25 AM. ?

## 2021-06-03 ENCOUNTER — Encounter: Payer: Self-pay | Admitting: Physician Assistant

## 2021-06-03 ENCOUNTER — Ambulatory Visit: Payer: BC Managed Care – PPO | Admitting: Physician Assistant

## 2021-06-03 ENCOUNTER — Ambulatory Visit (INDEPENDENT_AMBULATORY_CARE_PROVIDER_SITE_OTHER): Payer: BC Managed Care – PPO | Admitting: Physician Assistant

## 2021-06-03 VITALS — BP 115/70 | HR 68 | Ht 65.0 in | Wt 181.0 lb

## 2021-06-03 DIAGNOSIS — I10 Essential (primary) hypertension: Secondary | ICD-10-CM

## 2021-06-03 DIAGNOSIS — R7401 Elevation of levels of liver transaminase levels: Secondary | ICD-10-CM | POA: Diagnosis not present

## 2021-06-03 DIAGNOSIS — I2510 Atherosclerotic heart disease of native coronary artery without angina pectoris: Secondary | ICD-10-CM

## 2021-06-03 DIAGNOSIS — E785 Hyperlipidemia, unspecified: Secondary | ICD-10-CM

## 2021-06-03 DIAGNOSIS — I2584 Coronary atherosclerosis due to calcified coronary lesion: Secondary | ICD-10-CM | POA: Diagnosis not present

## 2021-06-03 MED ORDER — NITROGLYCERIN 0.4 MG SL SUBL: 0.4000 mg | SUBLINGUAL_TABLET | SUBLINGUAL | 3 refills | Status: AC | PRN

## 2021-06-03 NOTE — Progress Notes (Signed)
?Cardiology Office Note:   ? ?Date:  06/05/2021  ? ?ID:  Jasmine Hatfield, DOB February 13, 1960, MRN [id number] ? ?PCP:  Pcp, No ?  ?[facility] Providers ?Cardiologist:  Glenetta Hew, MD    ? ?Referring MD: No ref. provider found  ? ?Chief Complaint  ?Patient presents with  ? Follow-up  ?  Seen for Dr. Ellyn Hack  ? ? ?History of Present Illness:   ? ?Jasmine Hatfield is a 61 y.o. female with a hx of hypertension, hyperlipidemia, family history of premature CAD and elevated coronary calcium score.  Previous CT calcium scoring obtained on 09/23/2020 showed significant elevated coronary calcium score 1667, severe three-vessel coronary calcification.  Subsequent Myoview obtained on 10/01/2020 showed EF 60 to 65%, normal perfusion, no ischemia or infarction.  Patient was last seen by Dr. Ellyn Hack in September 2022 at which time she was doing well.  Her Crestor was increased to 40 mg daily.  She stopped taking hydrochlorothiazide.  She was started on metoprolol 25 mg twice a day.  Since then, metoprolol has been increased to 50 mg twice a day.   ? ?She presents today for follow-up.  She denies any recent exertional chest pain or worsening shortness of breath.  Last lipid panel obtained on 01/18/2021 showed a total cholesterol 135, HDL 34, triglyceride 133 and LDL 77.  Since she has very elevated coronary calcium score, I would recommend in for LDL 55 or lower.  I will refer her to the lipid clinic.  She says she has been having some numbness in her hand after starting on the higher dose of Crestor, however this only occur if she is sleeping on the same side.  I initially questioned if this is due to compression nerve, however she says it feels different from her previous symptom of compressing on the arm.  She believes the symptom is related to the Crestor although I am not confident about this.  I did recommend aiming for LDL of less than 55, I will refer her to the lipid clinic.  Her last liver enzyme obtained in December was mildly up,  I will repeat a liver enzyme.  She has not had any anginal symptom with exertion lately.  However she is quite obviously quite concerned about the possibility of having a heart attack in the future.  I reassured her that there is no evidence at this time that she has imminent possibility of having a heart attack.  I did give her a prescription for sublingual nitroglycerin since she travels a lot and lives quite remotely.  Otherwise, she can follow-up with Dr. Ellyn Hack in July. ? ?Past Medical History:  ?Diagnosis Date  ? Coronary artery disease due to calcified coronary lesion 09/23/2020  ? Coronary calcium score 1667 with severe three-vessel coronary calcification noted. ->  Myoview nonischemic with EF 60 to 65%.  ? Essential hypertension   ? Hyperlipidemia   ? Syncopal episodes 04/2020  ? Normal echo and monitor.  ? ? ?Past Surgical History:  ?Procedure Laterality Date  ? 48-HOUR HOLTER MONITOR  05/2020  ? [facility]:  Sinus rhythm was the predominant rhythm. Heart rate ranges from 65 to 131 bpm average 84 bpm. The longest R-R interval was 1.1 seconds. Only rare (<0.01%) supraventricular ectopies recorded No ventricular Ectopics recorded. No pauses of 3 seconds or longer were recorded. No recorded atrial fibrillation or atrial flutter. Diary symptoms noted: None  ? ANKLE SURGERY    ? CESAREAN SECTION    ? Has had 4  C-sections  ? NM MYOVIEW LTD  10/01/2020  ? EF 60 to 65%.  Normal perfusion with no evidence of Ischemia or Infarction.  LOW RISK  ? SPINE SURGERY    ? TONSILLECTOMY AND ADENOIDECTOMY    ? Childhood  ? TRANSTHORACIC ECHOCARDIOGRAM  05/19/2020  ? ([facility], [place]) normal LVEF 60 to 65%.  No R WMA.  GR 1 DD with elevated LVEDP..  Mild MAC.  Otherwise normal valves.  ? ? ?Current Medications: ?Current Meds  ?Medication Sig  ? aspirin EC 81 MG tablet Take 81 mg by mouth daily. Swallow whole.  ? nitroGLYCERIN (NITROSTAT) 0.4 MG SL tablet Place 1 tablet (0.4 mg total) under the  tongue every 5 (five) minutes as needed for chest pain.  ? olmesartan (BENICAR) 20 MG tablet Take 1 tablet (20 mg total) by mouth 2 (two) times daily.  ?  ? ?Allergies:   Patient has no known allergies.  ? ?Social History  ? ?Socioeconomic History  ? Marital status: Divorced  ?  Spouse name: Not on file  ? Number of children: 4  ? Years of education: 29  ? Highest education level: Some college, no degree  ?Occupational History  ?  Employer: other  ? 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  Comment: [employer], [employer] (previously _0  Office -now affiliated with [place] office  ?Tobacco Use  ? Smoking status: Every Day  ?  Packs/day: 1.00  ?  Years: 40.00  ?  Pack years: 40.00  ?  Types: Cigarettes  ? Smokeless tobacco: Never  ?Substance and Sexual Activity  ? Alcohol use: Yes  ?  Alcohol/week: 2.0 standard drinks  ?  Types: 2 Glasses of wine per week  ? Drug use: Never  ? Sexual activity: Yes  ?  Partners: Male  ?Other Topics Concern  ? Not on file  ?Social History Narrative  ? 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  She does travel somewhat work, but is able to work from home..  ? She is a divorced mother of 4.  (As of 2022: Ages 25, 59, 49 and 71. 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 She lives with her significant other - Forrest Forshmiedt Statistician).  ?   ? She acknowledges that she does not do any routine exercise.  ?   ? ?Social Determinants of Health  ? ?Financial Resource Strain: Not on file  ?Food Insecurity: Not on file  ?Transportation Needs: Not on file  ?Physical Activity: Not on file  ?Stress: Not on file  ?Social Connections: Not on file  ?  ? ?Family History: ?The patient's family history includes Cancer in her brother; Cancer - Colon in her paternal grandfather; Coronary artery disease (age of onset: 25) in her mother;  Coronary artery disease (age of onset: 16) in her father; Diabetes Mellitus II in her father; Heart attack (age of onset: 71) in her mother; Heart attack (age of onset: 58) in her father; Heart disease in her maternal grandfather; Hypertension in her brother, father, mother, and sister; Lung cancer in her maternal grandmother; Stroke in her paternal grandmother. ? ?ROS:   ?Please see the history of present illness.    ? All other systems reviewed and are negative. ? ?EKGs/Labs/Other Studies Reviewed:   ? ?The following studies were reviewed today: ? ?Myoview  10/01/2020 ?  The study is normal. The study is low risk. ?  No ST deviation was noted. ?  Nuclear stress EF: 61 %. The left ventricular ejection fraction is normal (55-65%). Left ventricular function is normal. End diastolic cavity size is normal. ?  ?Low risk stress nuclear study with normal perfusion and normal left ventricular regional and global systolic function. ? ?EKG:  EKG is ordered today.  The ekg ordered today demonstrates normal sinus rhythm, no significant ST-T wave changes. ? ?Recent Labs: ?09/23/2020: TSH 1.570 ?01/18/2021: ALT 39; BUN 21; Creatinine, Ser 0.66; Potassium 4.4; Sodium 141  ?Recent Lipid Panel ?   ?Component Value Date/Time  ? CHOL 135 01/18/2021 1127  ? TRIG 133 01/18/2021 1127  ? HDL 34 (L) 01/18/2021 1127  ? CHOLHDL 4.0 01/18/2021 1127  ? Fouke 77 01/18/2021 1127  ? ? ? ?Risk Assessment/Calculations:   ?  ? ?    ? ?Physical Exam:   ? ?VS:  BP 115/70   Pulse 68   Ht _0  (1.651 m)   Wt 181 lb (82.1 kg)   SpO2 96%   BMI 30.12 kg/m?    ? ?Wt Readings from Last 3 Encounters:  ?06/03/21 181 lb (82.1 kg)  ?10/16/20 170 lb (77.1 kg)  ?10/01/20 172 lb (78 kg)  ?  ? ?GEN:  Well nourished, well developed in no acute distress ?HEENT: Normal ?NECK: No JVD; No carotid bruits ?LYMPHATICS: No lymphadenopathy ?CARDIAC: RRR, no murmurs, rubs, gallops ?RESPIRATORY:  Clear to auscultation without rales, wheezing or rhonchi  ?ABDOMEN: Soft,  non-tender, non-distended ?MUSCULOSKELETAL:  No edema; No deformity  ?SKIN: Warm and dry ?NEUROLOGIC:  Alert and oriented x 3 ?PSYCHIATRIC:  Normal affect  ? ?ASSESSMENT:   ? ?1. Coronary artery calcificatio

## 2021-06-03 NOTE — Patient Instructions (Addendum)
Medication Instructions:  ?TAKE Nitroglycerin 0.4 mg as needed for chest pain. DO NOT exceed 3 tablets in an episode of chest pain.   ? ?*If you need a refill on your cardiac medications before your next appointment, please call your pharmacy* ? ?Lab Work: ?Your physician recommends that you return for lab work TODAY:  ?Hepatic (Liver) Function Test  ?If you have labs (blood work) drawn today and your tests are completely normal, you will receive your results only by: ?MyChart Message (if you have MyChart) OR ?A paper copy in the mail ?If you have any lab test that is abnormal or we need to change your treatment, we will call you to review the results. ? ?Testing/Procedures: ?NONE ordered at this time of appointment  ? ?Follow-Up: ?At [facility], you and your health needs are our priority.  As part of our continuing mission to provide you with exceptional heart care, we have created designated Provider Care Teams.  These Care Teams include your primary Cardiologist (physician) and Advanced Practice Providers (APPs -  Physician Assistants and Nurse Practitioners) who all work together to provide you with the care you need, when you need it. ?  ? ?Your next appointment:   ?As scheduled   ? ?The format for your next appointment:   ?In Person ? ?Provider:   ?Glenetta Hew, MD   ? ?Other Instructions ?You have been referred to [facility]  ? ? ?Important Information About Sugar ? ? ? ? ? ? ?

## 2021-06-05 ENCOUNTER — Encounter: Payer: Self-pay | Admitting: Physician Assistant

## 2021-06-08 ENCOUNTER — Other Ambulatory Visit: Payer: Self-pay | Admitting: Pharmacist Clinician (PhC)/ Clinical Pharmacy Specialist

## 2021-06-08 ENCOUNTER — Ambulatory Visit (INDEPENDENT_AMBULATORY_CARE_PROVIDER_SITE_OTHER): Payer: BC Managed Care – PPO | Admitting: Pharmacist Clinician (PhC)/ Clinical Pharmacy Specialist

## 2021-06-08 DIAGNOSIS — E785 Hyperlipidemia, unspecified: Secondary | ICD-10-CM | POA: Diagnosis not present

## 2021-06-08 LAB — HEPATIC FUNCTION PANEL
ALT: 43 IU/L — ABNORMAL HIGH (ref 0–32)
AST: 29 IU/L (ref 0–40)
Albumin: 4.5 g/dL (ref 3.8–4.9)
Alkaline Phosphatase: 61 IU/L (ref 44–121)
Bilirubin Total: 0.5 mg/dL (ref 0.0–1.2)
Bilirubin, Direct: 0.14 mg/dL (ref 0.00–0.40)
Total Protein: 6.8 g/dL (ref 6.0–8.5)

## 2021-06-08 MED ORDER — ROSUVASTATIN CALCIUM 20 MG PO TABS: 20.0000 mg | ORAL_TABLET | Freq: Every day | ORAL | 3 refills | Status: DC

## 2021-06-08 NOTE — Patient Instructions (Addendum)
Your Results: ?           ? Your most recent labs Goal  ?Total Cholesterol 135 < 200  ?Triglycerides 133 < 150  ?HDL (happy/good cholesterol) 34 > 40  ?LDL (lousy/bad cholesterol 77 < 55  ? ?Medication changes: ? We will start the process to get Repatha covered by your insurance.  Once the prior authorization is complete, Cinda [facility] will call you to let you know and confirm pharmacy information.   You will take Repatha 140 mg every 14 days.   ? ?Lab orders: ? We want to repeat labs after 2-3 months (lipid, liver and DM).  We will send you a lab order to remind you once we get closer to that time.   ? ? ?Thank you for choosing [facility]  ? ? ?

## 2021-06-08 NOTE — Assessment & Plan Note (Signed)
Patient with familial hyperlipidemia, with untreated LDL at 229.  Currently on rosuvastatin 40 mg, however having problems with side effects.  Will have her decrease dose to 20 mg daily.  Reviewed options for lowering LDL cholesterol, including ezetimibe, PCSK-9 inhibitors, bempedoic acid and inclisiran.  Discussed mechanisms of action, dosing, side effects and potential decreases in LDL cholesterol.  Also reviewed cost information and potential options for patient assistance.  Answered all patient questions.  Based on this information, patient would prefer to start Repatha 140 mg q14d.  Will repeat lipid, liver and A1c in 2-3 months.   ? ?

## 2021-06-08 NOTE — Progress Notes (Signed)
06/08/2021 ?Jasmine Hatfield ?1960/04/13 ?[id number] ? ? ?HPI:  Jasmine Hatfield is a 61 y.o. female patient of Dr Herbie Baltimore, who presents today for a lipid clinic evaluation.  See pertinent past medical history below.  She was most recently seen by Azalee Course PA, at which time she noted having some problems with numbness in her hands that started after increasing her rosuvastatin to 40 mg daily.  She has familial hyperlipidemia, as evident by LDL at 229 prior to taking the rosuvastatin.   ? ?Past Medical History: ?CAD Coronary calcium score 1667 (99th percentile)  ?hypertension Controlled on olmesartan, hctz, metoprolol  ?Tobacco abuse   ? ? ?Current Medications: rosuvastatin 20 ? ?Cholesterol Goals: LDL < 55 ?  ?Intolerant/previously tried:  rosuvastatin 40 - numbness in hands ? ?Family history: father with first MI at 21, also had leukemia, died from [place] fever; mother had first MI at 72, now 89 with CHF, HTN; 2 siblings, both with hypertension - brother's was at an early age. ? ?Diet: mostly home cooked, mix of meats, not many vegetables; not snacking much  ? ?Exercise:  no regular exercise ? ?Labs: 12/22:  TC 135, TG 133, HDL 34, LDL 77 (on rosuvastatin 40) ? 8/22:  TC 307, TG 203, HDL 38, LDL 229 (prior to medication) ? ? ?Current Outpatient Medications  ?Medication Sig Dispense Refill  ? ALPRAZolam (XANAX) 0.25 MG tablet Take 0.25 mg by mouth every 8 (eight) hours as needed. Takes when flying.    ? aspirin EC 81 MG tablet Take 81 mg by mouth daily. Swallow whole.    ? hydrochlorothiazide (MICROZIDE) 12.5 MG capsule Take 1 capsule (12.5 mg total) by mouth daily. 90 capsule 3  ? metoprolol tartrate (LOPRESSOR) 50 MG tablet Take 1 tablet (50 mg total) by mouth 2 (two) times daily. 180 tablet 3  ? nitroGLYCERIN (NITROSTAT) 0.4 MG SL tablet Place 1 tablet (0.4 mg total) under the tongue every 5 (five) minutes as needed for chest pain. 25 tablet 3  ? olmesartan (BENICAR) 20 MG tablet Take 1 tablet (20 mg total) by mouth 2 (two)  times daily. 180 tablet 3  ? rosuvastatin (CRESTOR) 40 MG tablet Take 1 tablet (40 mg total) by mouth daily. 90 tablet 3  ? ?No current facility-administered medications for this visit.  ? ? ?Allergies  ?Allergen Reactions  ? Crestor [Rosuvastatin]   ?  Tired myalgias  ? ? ?Past Medical History:  ?Diagnosis Date  ? Coronary artery disease due to calcified coronary lesion 09/23/2020  ? Coronary calcium score 1667 with severe three-vessel coronary calcification noted. ->  Myoview nonischemic with EF 60 to 65%.  ? Essential hypertension   ? Hyperlipidemia   ? Syncopal episodes 04/2020  ? Normal echo and monitor.  ? ? ?Blood pressure 106/70, pulse 70, resp. rate 18, height 5' 5.5" (1.664 m), weight 181 lb 12.8 oz (82.5 kg), SpO2 99 %. ? ? ?Hyperlipidemia with target LDL less than 70 ?Patient with familial hyperlipidemia, with untreated LDL at 229.  Currently on rosuvastatin 40 mg, however having problems with side effects.  Will have her decrease dose to 20 mg daily.  Reviewed options for lowering LDL cholesterol, including ezetimibe, PCSK-9 inhibitors, bempedoic acid and inclisiran.  Discussed mechanisms of action, dosing, side effects and potential decreases in LDL cholesterol.  Also reviewed cost information and potential options for patient assistance.  Answered all patient questions.  Based on this information, patient would prefer to start Repatha 140 mg q14d.  Will repeat lipid,  liver and A1c in 2-3 months.   ? ? ?Phillips Hay PharmD CPP [facility] ?[facility] ?[address] ?[place], [place] [address] ?[phone] ? ? ? ?

## 2021-06-09 NOTE — Progress Notes (Signed)
ALT is still mildly elevated, but not significantly changed when compare to the lab work from 4 month ago. Reassuring result.  Aware the patient was started on Repatha by lipid clinic, per lipid clinic note, they plan to repeat liver function in 2-3 month which is quite reasonable

## 2021-06-11 ENCOUNTER — Encounter: Payer: Self-pay | Admitting: Pharmacist Clinician (PhC)/ Clinical Pharmacy Specialist

## 2021-06-14 MED ORDER — EZETIMIBE 10 MG PO TABS: 10.0000 mg | ORAL_TABLET | Freq: Every day | ORAL | 3 refills | Status: DC

## 2021-06-17 ENCOUNTER — Encounter: Payer: Self-pay | Admitting: Pharmacist Clinician (PhC)/ Clinical Pharmacy Specialist

## 2021-08-16 ENCOUNTER — Ambulatory Visit (INDEPENDENT_AMBULATORY_CARE_PROVIDER_SITE_OTHER): Payer: BC Managed Care – PPO | Admitting: Cardiology

## 2021-08-16 ENCOUNTER — Encounter: Payer: Self-pay | Admitting: Cardiology

## 2021-08-16 VITALS — BP 114/66 | HR 69 | Ht 66.0 in | Wt 178.0 lb

## 2021-08-16 DIAGNOSIS — E785 Hyperlipidemia, unspecified: Secondary | ICD-10-CM | POA: Diagnosis not present

## 2021-08-16 DIAGNOSIS — Z8249 Family history of ischemic heart disease and other diseases of the circulatory system: Secondary | ICD-10-CM | POA: Diagnosis not present

## 2021-08-16 DIAGNOSIS — I10 Essential (primary) hypertension: Secondary | ICD-10-CM | POA: Diagnosis not present

## 2021-08-16 DIAGNOSIS — F17210 Nicotine dependence, cigarettes, uncomplicated: Secondary | ICD-10-CM | POA: Diagnosis not present

## 2021-08-16 DIAGNOSIS — I2510 Atherosclerotic heart disease of native coronary artery without angina pectoris: Secondary | ICD-10-CM | POA: Diagnosis not present

## 2021-08-16 DIAGNOSIS — Z87898 Personal history of other specified conditions: Secondary | ICD-10-CM

## 2021-08-16 DIAGNOSIS — R000 Tachycardia, unspecified: Secondary | ICD-10-CM

## 2021-08-16 LAB — COMPREHENSIVE METABOLIC PANEL
ALT: 66 IU/L — ABNORMAL HIGH (ref 0–32)
AST: 37 IU/L (ref 0–40)
Albumin/Globulin Ratio: 1.8 (ref 1.2–2.2)
Albumin: 4.4 g/dL (ref 3.8–4.9)
Alkaline Phosphatase: 61 IU/L (ref 44–121)
BUN/Creatinine Ratio: 43 — ABNORMAL HIGH (ref 12–28)
BUN: 27 mg/dL (ref 8–27)
Bilirubin Total: 0.3 mg/dL (ref 0.0–1.2)
CO2: 25 mmol/L (ref 20–29)
Calcium: 9.6 mg/dL (ref 8.7–10.3)
Chloride: 104 mmol/L (ref 96–106)
Creatinine, Ser: 0.63 mg/dL (ref 0.57–1.00)
Globulin, Total: 2.4 g/dL (ref 1.5–4.5)
Glucose: 113 mg/dL — ABNORMAL HIGH (ref 70–99)
Potassium: 5.2 mmol/L (ref 3.5–5.2)
Sodium: 140 mmol/L (ref 134–144)
Total Protein: 6.8 g/dL (ref 6.0–8.5)
eGFR: 101 mL/min/{1.73_m2} (ref >59)

## 2021-08-16 LAB — LIPID PANEL
Chol/HDL Ratio: 3.5 ratio (ref 0.0–4.4)
Cholesterol, Total: 130 mg/dL (ref 100–199)
HDL: 37 mg/dL — ABNORMAL LOW (ref >39)
LDL Chol Calc (NIH): 64 mg/dL (ref 0–99)
Triglycerides: 169 mg/dL — ABNORMAL HIGH (ref 0–149)
VLDL Cholesterol Cal: 29 mg/dL (ref 5–40)

## 2021-08-16 NOTE — Patient Instructions (Addendum)
Medication Instructions:   No changes  *If you need a refill on your cardiac medications before your next appointment, please call your pharmacy*   Lab Work: Lipid Cmp  If you have labs (blood work) drawn today and your tests are completely normal, you will receive your results only by: MyChart Message (if you have MyChart) OR A paper copy in the mail If you have any lab test that is abnormal or we need to change your treatment, we will call you to review the results.   Testing/Procedures:  Not needed  Follow-Up: At [facility], you and your health needs are our priority.  As part of our continuing mission to provide you with exceptional heart care, we have created designated Provider Care Teams.  These Care Teams include your primary Cardiologist (physician) and Advanced Practice Providers (APPs -  Physician Assistants and Nurse Practitioners) who all work together to provide you with the care you need, when you need it.     Your next appointment:   6 month(s)  The format for your next appointment:   In Person  Provider:   Bryan Lemma, MD  or Juanda Crumble, PA-C, Joni Reining, DNP, ANP, Azalee Course, PA-C, or Bernadene Person, NP    The.   Other Instructions

## 2021-08-16 NOTE — Progress Notes (Signed)
Primary Care Provider: Pcp, No Cardiologist: Glenetta Hew, MD Electrophysiologist: None  Clinic Note: Chief Complaint  Patient presents with   Follow-up    3 months.   Coronary Artery Disease    Nonocclusive by negative biopsy; Coronary Calcium Score 1667 => no anginal symptoms   ===================================  ASSESSMENT/PLAN   Problem List Items Addressed This Visit       Cardiology Problems   Coronary artery disease, non-occlusive - Primary (Chronic)    Severe three-vessel calcification noted on coronary calcium scoring with a score greater than 1600.  Myoview was nonischemic and that would suggest possibly that this means that there is stabilize plaque diffusely with no significant stenoses.  Plan: Continue aspirin 81 mg Continue 50 mg twice daily Lopressor-heart rate and blood pressure much better. 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  Rosuvastatin dose reduced to 20 mg daily with Zetia/ezetimibe 10 mg added => tolerating this combination well although she has Questions about Zetia => recheck labs this week to assess.  If not at goal, would have more data to present to her insurance company about being on Strong.  She did not understand why she was taking a medicine that works in the intestines to lower lipids, when she is working on her diet to reduce her lipid intake. => I explained the mechanism of action for Zetia is simply decreasing reuptake of excreted cholesterol from bile (as well as some dietary cholesterol.  This basically decreases how much cholesterol is  absorbed from the intestines into the bloodstream and therefore decreases the circulating volume. => Additional 10 minute discussion.      Relevant Orders   Lipid panel (Completed)   Comprehensive metabolic panel (Completed)   Essential hypertension (Chronic)    Blood pressure looks great.  We initially had stopped HCTZ in order to start metoprolol, but pressures are still high before we started HCTZ and increase metoprolol dose.  Now blood pressures look great on olmesartan 20 again, HCTZ 12.5 mg and metoprolol tartrate (Lopressor) 50 mg twice daily.  Only suggestion would be for ease of adherence to combined olmesartan & HCTZ and potentially consolidate Lopressor into 100 mg Toprol (metoprolol succinate) daily        Other   Heavy cigarette smoker (Chronic)    Best leg plans of smoking cessation after Christmas as a part of her New Year's resolution did not come to fruition.  She is still smoking.  She cites her stress as a major factor for why she smokes.  I explained to her that we can do all we can with medicines to lower her cholesterol level and blood pressure as her major risk factors, but as long as she keeps smoking, she is likely to suffer her parents fate.  She does not perceive herself stopping smoking until she makes this next move to [place].  Once she gets established, she thinks maybe she can try different options for quitting..  5-minute discussion      Family history of premature coronary artery disease (Chronic)    She is a same aged both her parents were  when they had heart attacks, she is definitely concerned and we are therefore being very aggressive with her lipid management.  Unfortunately for insurance reasons, she was not approved for Repatha.  She was started on Zetia along with 20 mg rosuvastatin.   Unfortunately, we do not have follow-up labs. => We will order lipids and chemistry today  Pending results on current meds, may still meet criteria for  approval for Repatha, however I suspect that if her LDL gets below 70 we would simply continue current regimen.      History of syncope (Chronic)    No further episodes of syncope.  Heart rate seems stable.  Continue to recommend adequate hydration and monitor for palpitations.      Sinus tachycardia (Chronic)    Notably better heart rate in the 60s.  Continue Lopressor 50 mg twice daily-for ease of dosing, if she would like we could switch her to 100 mg of Toprol once daily.       ===================================  HPI:    Jasmine Hatfield is a 61 y.o. female current smoker with a PMH notable for Family History of Premature CAD, Elevated Coronary Calcium Score (1667), Hyperlipidemia, HTN who presents today for 2-3 month f/u at the request of No ref. provider found.  I last saw Julitza Dorsey via telemedicine video visit in September to discuss test results: Coronary Ca++ Score 1667 -> consider Perfusion Study (unlikely able perform FFR CT) Myoview: LOW RISK. EF 55-65%. No ischemia or infarction. Labs: 8/22:  TC 307, TG 203, HDL 38, LDL 229 -> increased Rosuvastatin to 40 mg 12/22:  TC 135, TG 133, HDL 34, LDL 77 (on rosuvastatin 40)  She was last seen on by Almyra Deforest, PA on 06/03/2021 and referred to Tommy Medal, [place] from [facility] on 06/08/2021.  Also noted elevated transaminases. => Was having issues with hand numbness and aching after increasing rosuvastatin up to 40 mg.  LDL was 220 with significant drop after starting statin.  Felt to have familial hyperlipidemia.  Recommended Repatha = but has not yet started (due to insurance issues)-> started on ezetimibe  Several telephone calls about this issue.  Mostly fielded by Tommy Medal, [facility]- CCP  Recent Hospitalizations: None  Reviewed  CV studies:    The following studies were reviewed today: (if available, images/films reviewed: From Epic Chart or Care Everywhere) Coronary Calcium Score 09/23/2020: Total score 1667.  Severe  three-vessel coronary calcification.  (Consider perfusion study) Myoview Stress Test 10/01/2020: LOW RISK.  EF 60 to 65%.  No ischemia or infarction.  Interval History:   Katalena Malveaux returns here today for delayed follow-up visit with me.  Overall, she is doing pretty well.  She says once in a while she gets a little dizzy but seems to be doing okay.  She initially had some back pain when starting this Zetia/ezetimibe, but after taking a week off of taking it, there is no difference and therefore she went back onto it.  She is now taking rosuvastatin 20 mg along with ezetimibe 10 mg and things are doing relatively well.  No longer having the arm/hand numbness.  She also has not had any further near syncopal episodes and notes that her BP is stable.  .  Still smoking -> she knows that this is a risk factor but is not ready to make that change. She actually denies any symptoms whatsoever of chest pain or pressure at rest or exertion.  She admittedly is not very active, but with  what she does she denies any symptoms.  No resting dyspnea or chest pain.  No PND, orthopnea or edema.  Occasional skipped beats but nothing prolonged.  No rapid irregular heartbeats or significant palpitations. No syncope or near syncope, TIA/amaurosis fugax or claudication.  She is moving me in the end of the month back up to [place] for work requirements.  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  She is very concerned about her health and wellbeing, and wants to be closer to "[facility]" and is very concerned that [place] is not that close to [facility].   REVIEWED OF SYSTEMS   Review of Systems  Constitutional:  Negative for malaise/fatigue and weight loss.  HENT:  Negative for congestion and nosebleeds.   Respiratory:  Positive for cough (Morning cough), shortness of  breath (If she overdoes it) and wheezing. Negative for sputum production.   Cardiovascular:        Per HPI  Gastrointestinal:  Negative for blood in stool and melena.  Genitourinary:  Negative for hematuria.  Musculoskeletal:  Positive for joint pain. Negative for falls and myalgias.  Neurological:  Positive for dizziness (Only if she stands up too fast). Negative for focal weakness.  Psychiatric/Behavioral:  Negative for depression (Intermittent dysthymia symptoms.) and memory loss. The patient is nervous/anxious. The patient does not have insomnia.     I have reviewed and (if needed) personally updated the patient's problem list, medications, allergies, past medical and surgical history, social and family history.   PAST MEDICAL HISTORY   Past Medical History:  Diagnosis Date   Coronary artery disease due to calcified coronary lesion 09/23/2020   Coronary calcium score 1667 with severe three-vessel coronary calcification noted. ->  Myoview nonischemic with EF 60 to 65%.   Essential hypertension    Hyperlipidemia    Syncopal episodes 04/2020   Normal echo and monitor.    PAST SURGICAL HISTORY   Past Surgical History:  Procedure Laterality Date   48-HOUR HOLTER MONITOR  05/2020   [facility]:  Sinus rhythm was the predominant rhythm. Heart rate ranges from 65 to 131 bpm average 84 bpm. The longest R-R interval was 1.1 seconds. Only rare (<0.01%) supraventricular ectopies recorded No ventricular Ectopics recorded. No pauses of 3 seconds or longer were recorded. No recorded atrial fibrillation or atrial flutter. Diary symptoms noted: None   ANKLE SURGERY     CESAREAN SECTION     Has had 4 C-sections   NM MYOVIEW LTD  10/01/2020   EF 60 to 65%.  Normal perfusion with no evidence of Ischemia or Infarction.  LOW RISK   SPINE SURGERY     TONSILLECTOMY AND ADENOIDECTOMY     Childhood   TRANSTHORACIC ECHOCARDIOGRAM  05/19/2020   ([facility] [place]) normal  LVEF 60 to 65%.  No R WMA.  GR 1 DD with elevated LVEDP..  Mild MAC.  Otherwise normal valves.    There is no immunization history on file for this patient.  MEDICATIONS/ALLERGIES   Current Meds  Medication Sig   ALPRAZolam (XANAX) 0.25 MG tablet Take 0.25 mg by mouth every 8 (eight) hours as needed. Takes when flying.   aspirin EC 81 MG tablet Take 81 mg by mouth daily. Swallow whole.   ezetimibe (ZETIA) 10 MG tablet Take 1 tablet (  10 mg total) by mouth daily.   hydrochlorothiazide (MICROZIDE) 12.5 MG capsule Take 1 capsule (12.5 mg total) by mouth daily.   metoprolol tartrate (LOPRESSOR) 50 MG tablet Take 1 tablet (50 mg total) by mouth 2 (two) times daily.   nitroGLYCERIN (NITROSTAT) 0.4 MG SL tablet Place 1 tablet (0.4 mg total) under the tongue every 5 (five) minutes as needed for chest pain.   olmesartan (BENICAR) 20 MG tablet Take 1 tablet (20 mg total) by mouth 2 (two) times daily.   rosuvastatin (CRESTOR) 20 MG tablet Take 1 tablet (20 mg total) by mouth daily.    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  She does travel somewhat work, but is able to work from home..   She is a divorced mother of 4.  (As of 2022: Ages 87, 42, 34 and 76. 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      Diet: mostly home cooked, mix of meats, not many vegetables; not snacking much       She acknowledges that she does not do any routine exercise.      Family History: father with first MI at 25, also had leukemia, died from [place] fever; mother had first MI at 23, now 75 with CHF, HTN; 2 siblings, both with hypertension - brother's was at an early age.    OBJCTIVE -PE, EKG, labs   Wt Readings from Last 3 Encounters:  08/16/21 178 lb (80.7 kg)  06/08/21 181 lb 12.8 oz (82.5 kg)  06/03/21 181 lb (82.1 kg)    Physical Exam: BP 114/66 (BP Location: Left Arm, Patient Position: Sitting, Cuff Size: Normal)   Pulse 69   Ht _0  (1.676 m)   Wt 178 lb (80.7 kg)   BMI 28.73 kg/m  Physical Exam Vitals reviewed.  Constitutional:      General: She is not in acute distress.    Appearance: Normal appearance. She is normal weight. She is not ill-appearing or toxic-appearing.     Comments: Well-groomed, but smells like cigarettes  HENT:     Head: Normocephalic and atraumatic.  Neck:     Vascular: No carotid bruit.  Cardiovascular:     Rate and Rhythm: Normal rate and regular rhythm.     Pulses: Normal pulses.     Heart sounds: Normal heart sounds. No murmur heard.    No friction rub. No gallop.  Pulmonary:     Effort: Pulmonary effort is normal. No respiratory distress.     Breath sounds: No stridor. Wheezing (Forced expiratory wheezing but not with routine breathing) present. No rhonchi or rales.  Chest:  Chest wall: Tenderness present.  Musculoskeletal:        General: No swelling. Normal range of motion.     Cervical back: Normal range of motion and neck supple.  Skin:    General: Skin is warm and dry.  Neurological:     General: No focal deficit present.     Mental Status: She is alert and oriented to person, place, and time.     Gait: Gait normal.  Psychiatric:        Mood and Affect: Mood normal.        Behavior: Behavior normal.         Thought Content: Thought content normal.        Judgment: Judgment normal.     Adult ECG Report Not checked  Recent Labs:  reviewed => need new labs ; ordered today Lab Results  Component Value Date   CHOL 135 01/18/2021   HDL 34 (L) 01/18/2021   LDLCALC 77 01/18/2021   TRIG 133 01/18/2021   CHOLHDL 4.0 01/18/2021   Lab Results  Component Value Date   CREATININE 0.66 01/18/2021   BUN 21 01/18/2021   NA 141 01/18/2021   K 4.4 01/18/2021   CL 103 01/18/2021   CO2 24 01/18/2021       No data to display          Lab Results  Component Value Date   HGBA1C 5.9 (H) 09/23/2020   Lab Results  Component Value Date   TSH 1.570 09/23/2020    ==================================================  I spent a total of 26 minutes with the patient spent in direct patient consultation.  Additional time spent with chart review  / charting (studies, outside notes, etc): 16 min Total Time: 42 min  Current medicines are reviewed at length with the patient today.  (+/- concerns) n/a  Notice: This dictation was prepared with Dragon dictation along with smart phrase technology. Any transcriptional errors that result from this process are unintentional and may not be corrected upon review.  Studies Ordered:   Orders Placed This Encounter  Procedures   Lipid panel   Comprehensive metabolic panel   No orders of the defined types were placed in this encounter.   Patient Instructions / Medication Changes & Studies & Tests Ordered   Patient Instructions  Medication Instructions:   No changes  *If you need a refill on your cardiac medications before your next appointment, please call your pharmacy*   Lab Work: Lipid Cmp  If you have labs (blood work) drawn today and your tests are completely normal, you will receive your results only by: MyChart Message (if you have MyChart) OR A paper copy in the mail If you have any lab test that is abnormal or we need to change your  treatment, we will call you to review the results.   Testing/Procedures:  Not needed  Follow-Up: At [facility] you and your health needs are our priority.  As part of our continuing mission to provide you with exceptional heart care, we have created designated Provider Care Teams.  These Care Teams include your primary Cardiologist (physician) and Advanced Practice Providers (APPs -  Physician Assistants and Nurse Practitioners) who all work together to provide you with the care you need, when you need it.     Your next appointment:   6 month(s)  The format for your next appointment:   In Person  Provider:   Glenetta Hew, MD  or Caron Presume, PA-C, Jory Sims, DNP,  ANP, Almyra Deforest, PA-C, or Diona Browner, NP    The.   Other Instructions      Glenetta Hew, M.D., M.S. Interventional Cardiologist   Pager # [phone] Phone # [phone] [address]. [place], [place] [address]   Thank you for choosing Heartcare at [facility]!!

## 2021-08-17 ENCOUNTER — Telehealth: Payer: Self-pay | Admitting: *Deleted

## 2021-08-17 DIAGNOSIS — I2510 Atherosclerotic heart disease of native coronary artery without angina pectoris: Secondary | ICD-10-CM

## 2021-08-17 DIAGNOSIS — E785 Hyperlipidemia, unspecified: Secondary | ICD-10-CM

## 2021-08-17 DIAGNOSIS — R7401 Elevation of levels of liver transaminase levels: Secondary | ICD-10-CM

## 2021-08-17 NOTE — Telephone Encounter (Signed)
Left detailed message , on voicemail per dpr.  Any question may call back.  Labslip will be mailed

## 2021-08-17 NOTE — Telephone Encounter (Signed)
-----   Message from Marykay Lex, MD sent at 08/17/2021 12:55 AM EDT ----- Lipid panel looks pretty good.  Total cholesterol stable.  Triglycerides are up a little bit but the LDL is down to 64 on this current regimen.  Interestingly, despite going down the statin dose the ALT is still little high.  I think that is something we should watch, but we continue on current medication and reassess liver function status as well as lipids in 3 months.  Bryan Lemma, MD

## 2021-08-18 ENCOUNTER — Encounter: Payer: Self-pay | Admitting: Cardiology

## 2021-08-18 NOTE — Telephone Encounter (Signed)
My understanding was that the initial reason for lowering the dose of rosuvastatin was because there was concerns of hand numbness on the higher dose of 40 mg.  We had a lipids looking pretty well controlled with 40 of rosuvastatin, but there was concern for side effect.  The reason for then sending you to the lipid clinic was because our clinical pharmacist are best equipped with handling the referral process for try to get these new medications approved.  One of the steps is that they require trying an additional nonexpensive new medication.  Ezetimibe while it works in the intestines is not diet related.  What ezetimibe does as it decreases the reabsorption of cholesterol that is excreted in your bile.  It does not have anything to do with your diet.  It simply decreases your absorption of cholesterol.  This is a process that we had to play with insurance companies in order to get people on appropriate medicines.  It is the loop holes that we have to go through in order to get insurance companies to agree to pay for expensive medications.  The liver function tests ALT is a little bit elevated it is not anywhere near the danger zone but it is something that we do pay attention to a make sure that I do not continue to climb.  This is another reason why it makes sense to stay on a lower dose of medicine that does have an effect of the liver and use something like Zetia which should not affect her liver.    The cholesterol level goes up because ezetimibe is nowhere near as potent as the additional 20 mg of rosuvastatin when it comes to lipid lowering.  As for the mobile EKG monitor the recommendation is Mayford Knife - you can simply Google it & it takes you to the web site.  Bryan Lemma, MD

## 2021-08-29 ENCOUNTER — Encounter: Payer: Self-pay | Admitting: Cardiology

## 2021-08-29 NOTE — Assessment & Plan Note (Signed)
Severe three-vessel calcification noted on coronary calcium scoring with a score greater than 1600.  Myoview was nonischemic and that would suggest possibly that this means that there is stabilize plaque diffusely with no significant stenoses.  Plan:  Continue aspirin 81 mg  Continue 50 mg twice daily Lopressor-heart rate and blood pressure much better.  Continue ARB plus HCTZ (question if this could be combined)  Continue rosuvastatin 20 mg plus ezetimibe 10 mg => follow-up lipid panel/CMP ordered today

## 2021-08-29 NOTE — Assessment & Plan Note (Signed)
Notably better heart rate in the 60s.  Continue Lopressor 50 mg twice daily-for ease of dosing, if she would like we could switch her to 100 mg of Toprol once daily.

## 2021-08-29 NOTE — Assessment & Plan Note (Addendum)
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  Rosuvastatin dose reduced to 20 mg daily with Zetia/ezetimibe 10 mg added => tolerating this combination well although she has Questions about Zetia => recheck labs this week to assess.  If not at goal, would have more data to present to her insurance company about being on Repatha.  She did not understand why she was taking a medicine that works in the intestines to lower lipids, when she is working on her diet to reduce her lipid intake. => I explained the mechanism of action for Zetia is simply decreasing reuptake of excreted cholesterol from bile (as well as some dietary cholesterol.  This basically decreases how much cholesterol is absorbed from the intestines into the bloodstream and therefore decreases the circulating volume. => Additional 10 minute discussion.

## 2021-08-29 NOTE — Assessment & Plan Note (Signed)
No further episodes of syncope.  Heart rate seems stable.  Continue to recommend adequate hydration and monitor for palpitations.

## 2021-08-29 NOTE — Assessment & Plan Note (Signed)
Blood pressure looks great.  We initially had stopped HCTZ in order to start metoprolol, but pressures are still high before we started HCTZ and increase metoprolol dose.  Now blood pressures look great on olmesartan 20 again, HCTZ 12.5 mg and metoprolol tartrate (Lopressor) 50 mg twice daily.  Only suggestion would be for ease of adherence to combined olmesartan & HCTZ and potentially consolidate Lopressor into 100 mg Toprol (metoprolol succinate) daily

## 2021-08-29 NOTE — Assessment & Plan Note (Signed)
She is a same aged both her parents were when they had heart attacks, she is definitely concerned and we are therefore being very aggressive with her lipid management.  Unfortunately for insurance reasons, she was not approved for Repatha.  She was started on Zetia along with 20 mg rosuvastatin.   Unfortunately, we do not have follow-up labs. => We will order lipids and chemistry today   Pending results on current meds, may still meet criteria for approval for Repatha, however I suspect that if her LDL gets below 70 we would simply continue current regimen.

## 2021-08-29 NOTE — Assessment & Plan Note (Addendum)
Best leg plans of smoking cessation after Christmas as a part of her New Year's resolution did not come to fruition.  She is still smoking.  She cites her stress as a major factor for why she smokes.  I explained to her that we can do all we can with medicines to lower her cholesterol level and blood pressure as her major risk factors, but as long as she keeps smoking, she is likely to suffer her parents fate.  She does not perceive herself stopping smoking until she makes this next move to [place].  Once she gets established, she thinks maybe she can try different options for quitting..  5-minute discussion

## 2021-09-16 ENCOUNTER — Other Ambulatory Visit: Payer: Self-pay | Admitting: Cardiology

## 2021-10-09 ENCOUNTER — Other Ambulatory Visit: Payer: Self-pay | Admitting: Cardiology

## 2021-11-01 ENCOUNTER — Other Ambulatory Visit: Payer: Self-pay | Admitting: Cardiology

## 2021-11-09 ENCOUNTER — Encounter: Payer: Self-pay | Admitting: Cardiology

## 2021-11-09 MED ORDER — HYDROCHLOROTHIAZIDE 12.5 MG PO CAPS: 12.5000 mg | ORAL_CAPSULE | Freq: Every day | ORAL | 3 refills | Status: AC

## 2021-12-18 ENCOUNTER — Encounter: Payer: Self-pay | Admitting: Cardiology

## 2021-12-20 ENCOUNTER — Other Ambulatory Visit: Payer: Self-pay

## 2021-12-20 MED ORDER — METOPROLOL TARTRATE 50 MG PO TABS: 50.0000 mg | ORAL_TABLET | Freq: Two times a day (BID) | ORAL | 3 refills | Status: AC

## 2022-02-17 ENCOUNTER — Encounter: Payer: Self-pay | Admitting: Physician Assistant

## 2022-02-17 ENCOUNTER — Ambulatory Visit: Payer: BC Managed Care – PPO | Attending: Physician Assistant | Admitting: Physician Assistant

## 2022-02-17 VITALS — BP 98/60 | HR 73 | Ht 65.5 in | Wt 174.6 lb

## 2022-02-17 DIAGNOSIS — I10 Essential (primary) hypertension: Secondary | ICD-10-CM

## 2022-02-17 DIAGNOSIS — E785 Hyperlipidemia, unspecified: Secondary | ICD-10-CM | POA: Diagnosis not present

## 2022-02-17 DIAGNOSIS — I2510 Atherosclerotic heart disease of native coronary artery without angina pectoris: Secondary | ICD-10-CM

## 2022-02-17 DIAGNOSIS — I2584 Coronary atherosclerosis due to calcified coronary lesion: Secondary | ICD-10-CM

## 2022-02-17 NOTE — Patient Instructions (Addendum)
Medication Instructions:  Your physician recommends that you continue on your current medications as directed. Please refer to the Current Medication list given to you today.  *If you need a refill on your cardiac medications before your next appointment, please call your pharmacy*  Lab Work: Your physician recommends that you return for lab work TODAY:  CBC CMP FLP  If you have labs (blood work) drawn today and your tests are completely normal, you will receive your results only by: [place] (if you have MyChart) OR A paper copy in the mail If you have any lab test that is abnormal or we need to change your treatment, we will call you to review the results.  Testing/Procedures: NONE ordered at this time of appointment   Follow-Up: At [facility], you and your health needs are our priority.  As part of our continuing mission to provide you with exceptional heart care, we have created designated Provider Care Teams.  These Care Teams include your primary Cardiologist (physician) and Advanced Practice Providers (APPs -  Physician Assistants and Nurse Practitioners) who all work together to provide you with the care you need, when you need it.   Your next appointment:   6 month(s)  Provider:   Glenetta Hew, MD     Other Instructions

## 2022-02-17 NOTE — Progress Notes (Signed)
Cardiology Office Note:    Date:  02/19/2022   ID:  Jasmine Hatfield, DOB December 22, 1960, MRN [id number]  PCP:  Kathyrn Lass   [place] Prov[facility] Cardiologist:  Glenetta Hew, MD     Referring MD: No ref. provider found   Chief Complaint  Patient presents with   Follow-up    Seen for Dr. Ellyn Hack    History of Present Illness:    Jasmine Hatfield is a 62 y.o. female with a hx of hypertension, hyperlipidemia, family history of premature CAD and elevated coronary calcium score. Previous CT calcium scoring obtained on 09/23/2020 showed significant elevated coronary calcium score 1667, severe three-vessel coronary calcification. Subsequent Myoview obtained on 10/01/2020 showed EF 60 to 65%, normal perfusion, no ischemia or infarction.  Crestor was increased to 40 mg daily.  Unfortunately she had side effects with higher doses, Crestor was later reduced to 20 mg daily with addition of Zetia. I last saw the patient in April 2023 at which time she was doing well.  I did refer her to lipid clinic because of very elevated coronary calcium score so I recommend a lower LDL goal of 55.  She was seen by lipid clinic in May 2023 and they recommended Repatha.  Unfortunately her insurance company did not cover Abeytas or any other newer medication until she has at least tried Zetia.  Patient presents today for follow-up.  She did not have any recent chest pain or worsening dyspnea.  She has no lower extremity edema.  Will obtain lab work including CMP, CBC and a fasting lipid panel today.  On the previous blood work, her ALT was elevated although AST was normal.  She remains fairly active without any issues.  She can follow-up in 6 months if blood work stable.  Note, patient likely will move to [place] in about 15-month on the next follow-up, we can print out her stress test and coronary calcium score for her to take it to the next cardiologist.  Past Medical History:  Diagnosis Date   Coronary artery  disease due to calcified coronary lesion 09/23/2020   Coronary calcium score 1667 with severe three-vessel coronary calcification noted. ->  Myoview nonischemic with EF 60 to 65%.   Essential hypertension    Hyperlipidemia    Syncopal episodes 04/2020   Normal echo and monitor.    Past Surgical History:  Procedure Laterality Date   48-HOUR [facility]  [facility]  Sinus rhythm was the predominant rhythm. Heart rate ranges from 65 to 131 bpm average 84 bpm. The longest R-R interval was 1.1 seconds. Only rare (<0.01%) supraventricular ectopies recorded No ventricular Ectopics recorded. No pauses of 3 seconds or longer were recorded. No recorded atrial fibrillation or atrial flutter. Diary symptoms noted: None   ANKLE SURGERY     CESAREAN SECTION     Has had 4 C-sections   NM MYOVIEW LTD  10/01/2020   EF 60 to 65%.  Normal perfusion with no evidence of Ischemia or Infarction.  LOW RISK   SPINE SURGERY     TONSILLECTOMY AND ADENOIDECTOMY     Childhood   TRANSTHORACIC ECHOCARDIOGRAM  05/19/2020   [place] LVEF 60 to 65%.  No R WMA.  GR 1 DD with elevated LVEDP..  Mild MAC.  Otherwise normal valves.    Current Medications: Current Meds  Medication Sig   aspirin EC 81 MG tablet Take 81 mg by mouth daily. Swallow whole.   ezetimibe (  ZETIA) 10 MG tablet Take 1 tablet (10 mg total) by mouth daily.   hydrochlorothiazide (MICROZIDE) 12.5 MG capsule Take 1 capsule (12.5 mg total) by mouth daily.   metoprolol tartrate (LOPRESSOR) 50 MG tablet Take 1 tablet (50 mg total) by mouth 2 (two) times daily.   olmesartan (BENICAR) 20 MG tablet TAKE 1 TABLET BY MOUTH TWICE A DAY   rosuvastatin (CRESTOR) 20 MG tablet Take 1 tablet (20 mg total) by mouth daily.     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, but travels;    Comment: [employer] (previously _0  Office -now affiliated with [place] office  Tobacco Use   Smoking status: Every Day    Packs/day: 1.00    Years: 40.00    Total pack years: 40.00    Types: Cigarettes   Smokeless tobacco: Never   Tobacco comments:    02/17/2022 patient smokes about a pack a day  Substance and Sexual Activity   Alcohol use: Yes    Alcohol/week: 2.0 standard drinks of alcohol    Types: 2 Glasses of wine per week   Drug use: Never   Sexual activity: Yes    Partners: Male  Other Topics Concern   Not on file  Social History Narrative   She moved back from [place] to [place] staying with the same job-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  She does travel somewhat work, but is able to work from home..   She is a divorced mother of 4.  (As of 2022: Ages 48, 71, 39 and 13. 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      Diet: mostly home cooked, mix of meats, not many vegetables; not snacking much       She acknowledges that she does not do any routine exercise.      Social Determinants of Health   Financial Resource Strain: Not on file  Food Insecurity: Not on file  Transportation Needs: Not on file  Physical Activity: Not on file  Stress: Not on file  Social Connections: Not on file     Family History: The patient's family history includes Cancer in her brother; Cancer - Colon in her paternal grandfather; Coronary artery disease in her brother and sister; Coronary artery disease (age of onset: 85) in her mother; Coronary artery disease (age of onset: 18) in her father; Diabetes Mellitus II in her father; Heart attack (age of  onset: 28) in her mother; Heart attack (age of onset: 25) in her father; Heart disease in her maternal grandfather; Hypertension in her brother, father, mother, and sister; Lung cancer in her maternal grandmother; Stroke in her paternal grandmother.  ROS:   Please see the history of present illness.     All other systems reviewed and are negative.  EKGs/Labs/Other Studies Reviewed:    The following studies were reviewed today:  Coronary calcium 09/23/2020 FINDINGS: Non-cardiac: See separate report from [facility].   Ascending aorta: Normal diameter 3.3 cm   Pericardium: Normal   Coronary  arteries: Severe 3 vessel coronary calcium   IMPRESSION: Coronary calcium score of 1667. This was 74 th percentile for age and sex matched control.    Myoview 10/01/2020   The study is normal. The study is low risk.   No ST deviation was noted.   Nuclear stress EF: 61 %. The left ventricular ejection fraction is normal (55-65%). Left ventricular function is normal. End diastolic cavity size is normal.   Low risk stress nuclear study with normal perfusion and normal left ventricular regional and global systolic function.   EKG:  EKG is ordered today.  The ekg ordered today demonstrates normal sinus rhythm, no significant ST-T wave changes.  Recent Labs: 02/17/2022: ALT 45; BUN 14; Creatinine, Ser 0.61; Hemoglobin 13.2; Platelets 210; Potassium 4.1; Sodium 141  Recent Lipid Panel    Component Value Date/Time   CHOL 118 02/17/2022 1421   TRIG 99 02/17/2022 1421   HDL 44 02/17/2022 1421   CHOLHDL 2.7 02/17/2022 1421   LDLCALC 55 02/17/2022 1421     Risk Assessment/Calculations:           Physical Exam:    VS:  BP 98/60 (BP Location: Left Arm, Patient Position: Sitting, Cuff Size: Normal)   Pulse 73   Ht 5' 5.5" (1.664 m)   Wt 174 lb 9.6 oz (79.2 kg)   SpO2 94%   BMI 28.61 kg/m        Wt Readings from Last 3 Encounters:  02/17/22 174 lb 9.6 oz (79.2 kg)  08/16/21  178 lb (80.7 kg)  06/08/21 181 lb 12.8 oz (82.5 kg)     GEN: Well nourished, well developed in no acute distress HEENT: Normal NECK: No JVD; No carotid bruits LYMPHATICS: No lymphadenopathy CARDIAC: RRR, no murmurs, rubs, gallops RESPIRATORY:  Clear to auscultation without rales, wheezing or rhonchi  ABDOMEN: Soft, non-tender, non-distended MUSCULOSKELETAL:  No edema; No deformity  SKIN: Warm and dry NEUROLOGIC:  Alert and oriented x 3 PSYCHIATRIC:  Normal affect   ASSESSMENT:    1. Coronary artery calcification   2. Hyperlipidemia, unspecified hyperlipidemia type   3. Essential hypertension    PLAN:    In order of problems listed above:  Coronary artery calcification: Previous CT scan in August 2022 revealed coronary calcium score of 1667.  Follow-up Myoview was normal.  Patient denies any significant chest discomfort  Hyperlipidemia: Currently on Zetia and Crestor.  Her insurance did not cover Repatha.  Obtain fasting lipid panel and CMP  Hypertension: Blood pressure stable.           Medication Adjustments/Labs and Tests Ordered: Current medicines are reviewed at length with the patient today.  Concerns regarding medicines are outlined above.  Orders Placed This Encounter  Procedures   CBC   Lipid panel   Comprehensive metabolic panel   EKG 17-OHYW   No orders of the defined types were placed in this encounter.   Patient Instructions  Medication Instructions:  Your physician recommends that you continue on your current medications as directed. Please refer to the Current Medication list given to you today.  *If you need a refill on your cardiac medications before your next appointment, please call your pharmacy*  Lab Work: Your physician recommends that you return for lab work TODAY:  CBC CMP FLP  If you have labs (blood work) drawn today and your tests are completely normal, you will receive your results only by: [place] (if you have MyChart)  OR A paper copy in the mail  If you have any lab test that is abnormal or we need to change your treatment, we will call you to review the results.  Testing/Procedures: NONE ordered at this time of appointment   Follow-Up: At [facility], you and your health needs are our priority.  As part of our continuing mission to provide you with exceptional heart care, we have created designated Provider Care Teams.  These Care Teams include your primary Cardiologist (physician) and Advanced Practice Providers (APPs -  Physician Assistants and Nurse Practitioners) who all work together to provide you with the care you need, when you need it.   Your next appointment:   6 month(s)  Provider:   Glenetta Hew, MD     Other Instructions     Signed, Almyra Deforest, [place]  02/19/2022 9:56 PM    [place]

## 2022-02-18 LAB — COMPREHENSIVE METABOLIC PANEL
ALT: 45 IU/L — ABNORMAL HIGH (ref 0–32)
AST: 32 IU/L (ref 0–40)
Albumin/Globulin Ratio: 2.4 — ABNORMAL HIGH (ref 1.2–2.2)
Albumin: 4.7 g/dL (ref 3.8–4.9)
Alkaline Phosphatase: 65 IU/L (ref 44–121)
BUN/Creatinine Ratio: 23 (ref 12–28)
BUN: 14 mg/dL (ref 8–27)
Bilirubin Total: 0.7 mg/dL (ref 0.0–1.2)
CO2: 23 mmol/L (ref 20–29)
Calcium: 9.7 mg/dL (ref 8.7–10.3)
Chloride: 101 mmol/L (ref 96–106)
Creatinine, Ser: 0.61 mg/dL (ref 0.57–1.00)
Globulin, Total: 2 g/dL (ref 1.5–4.5)
Glucose: 93 mg/dL (ref 70–99)
Potassium: 4.1 mmol/L (ref 3.5–5.2)
Sodium: 141 mmol/L (ref 134–144)
Total Protein: 6.7 g/dL (ref 6.0–8.5)
eGFR: 102 mL/min/{1.73_m2} (ref >59)

## 2022-02-18 LAB — LIPID PANEL
Chol/HDL Ratio: 2.7 ratio (ref 0.0–4.4)
Cholesterol, Total: 118 mg/dL (ref 100–199)
HDL: 44 mg/dL (ref >39)
LDL Chol Calc (NIH): 55 mg/dL (ref 0–99)
Triglycerides: 99 mg/dL (ref 0–149)
VLDL Cholesterol Cal: 19 mg/dL (ref 5–40)

## 2022-02-18 LAB — CBC
Hematocrit: 39.8 % (ref 34.0–46.6)
Hemoglobin: 13.2 g/dL (ref 11.1–15.9)
MCH: 30.4 pg (ref 26.6–33.0)
MCHC: 33.2 g/dL (ref 31.5–35.7)
MCV: 92 fL (ref 79–97)
Platelets: 210 10*3/uL (ref 150–450)
RBC: 4.34 x10E6/uL (ref 3.77–5.28)
RDW: 12.2 % (ref 11.7–15.4)
WBC: 7.3 10*3/uL (ref 3.4–10.8)

## 2022-02-19 ENCOUNTER — Encounter: Payer: Self-pay | Admitting: Physician Assistant

## 2022-04-02 IMAGING — CT CT CARDIAC CORONARY ARTERY CALCIUM SCORE
3 series · 14 of 20 positions shown, 15 images · non-contrast
Comparison: None.
COMPARISON: None.

Addendum:
EXAM:
OVER-READ INTERPRETATION  CT CHEST

The following report is an over-read performed by radiologist Dr.
Jing Ayers [REDACTED] on 09/23/2020. This over-read
does not include interpretation of cardiac or coronary anatomy or
pathology. The calcium score interpretation by the cardiologist is
attached.
CLINICAL DATA: Risk stratification
Coronary Calcium Score
TECHNIQUE: The patient was scanned on a Siemens Somatom 64 slice scanner. Axial
non-contrast 3 mm slices were carried out through the heart. The
data set was analyzed on a dedicated work station and scored using
the Agatson method.

[Series 2: cascseq 2.0 bf37 st · axial · 0.73mm/px · z∈[-258,-178]mm · 4 of 68 slices shown, 5 images]
[im 14/68  vessel]
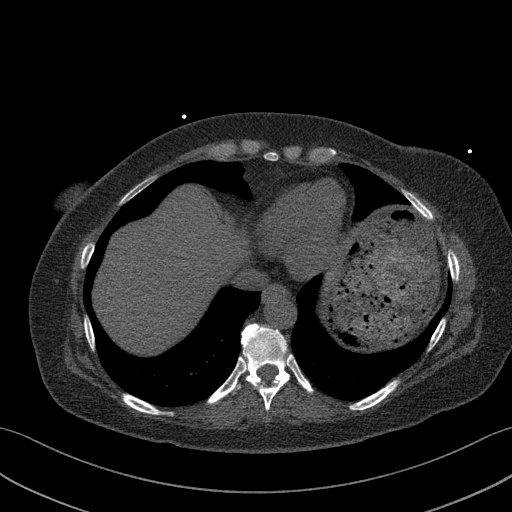
[im 14/68  lung]
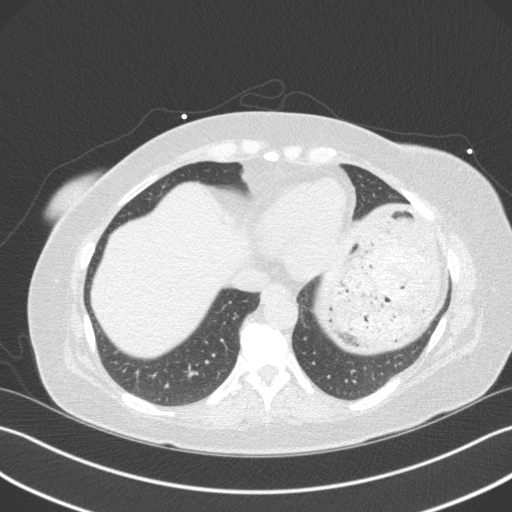
[im 27/68  vessel]
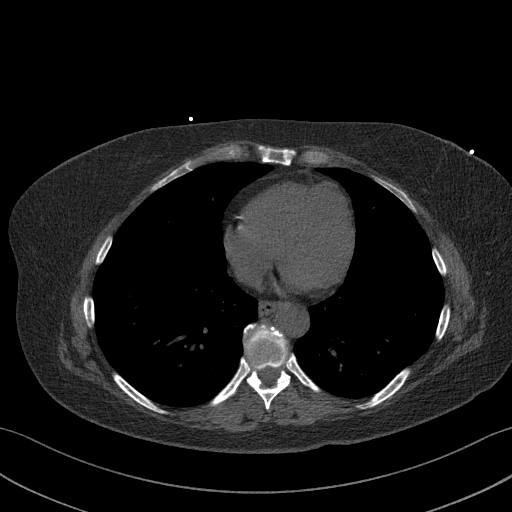
[im 41/68  vessel]
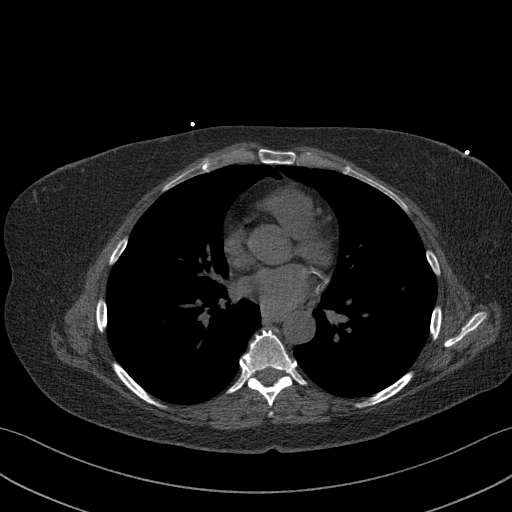
[im 54/68  vessel]
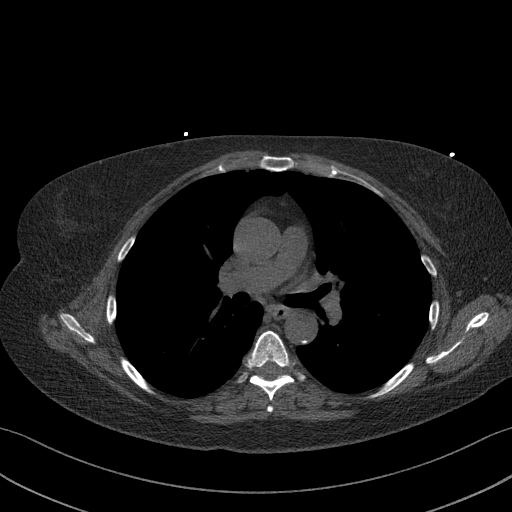

[Series 3: cascseq 2.0 br59 lung · axial · 0.73mm/px · z∈[-262,-174]mm · 5 of 68 slices shown]
[im 12/68  lung]
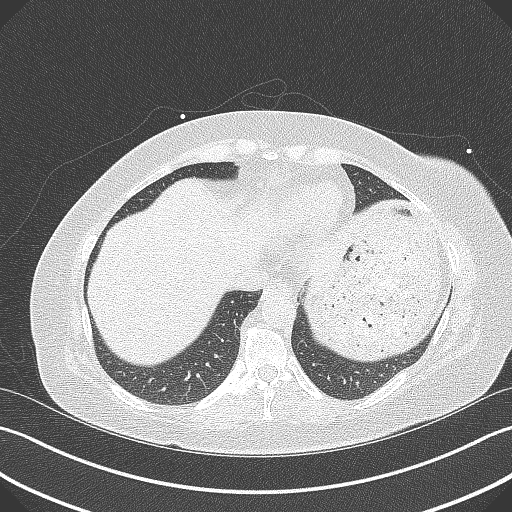
[im 23/68  lung]
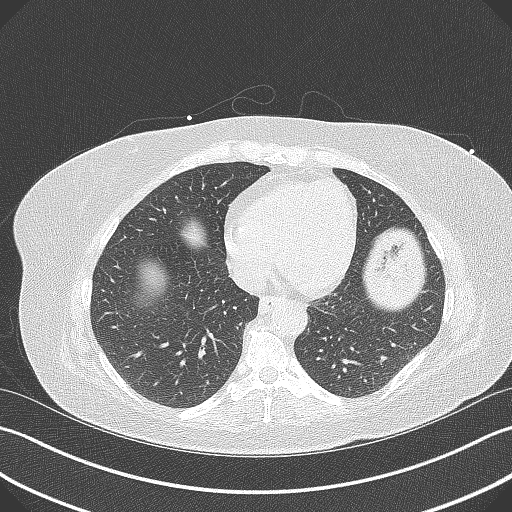
[im 34/68  lung]
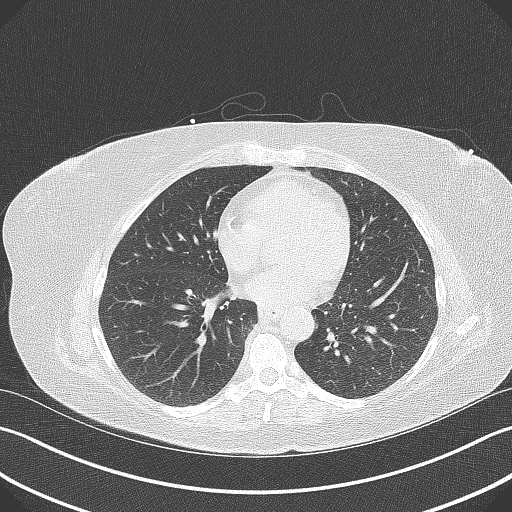
[im 45/68  lung]
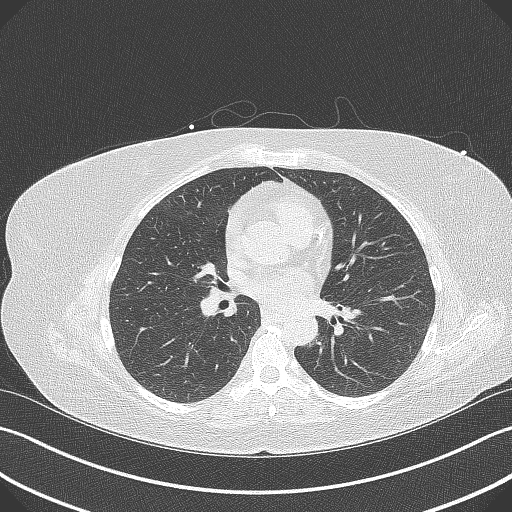
[im 56/68  lung]
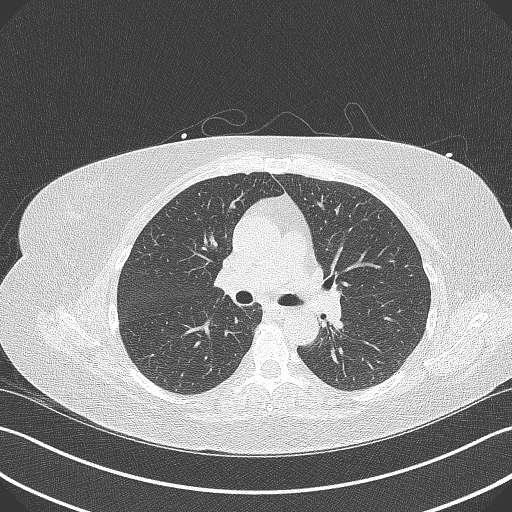

[Series 4: cascseq 2.0 sa36 70% (id) · axial · 0.39mm/px · z∈[-262,-174]mm · 5 of 68 slices shown]
[im 12/68  vessel]
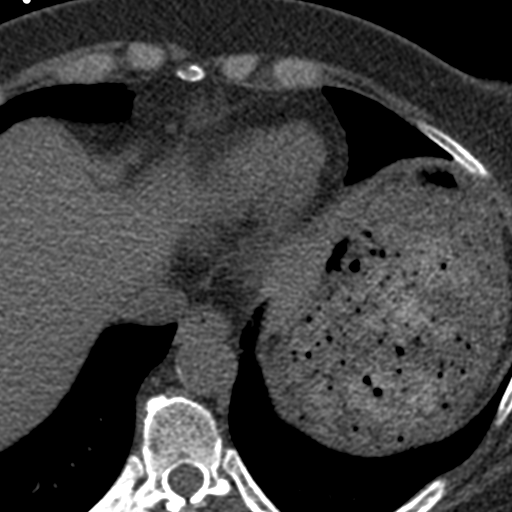
[im 23/68  vessel]
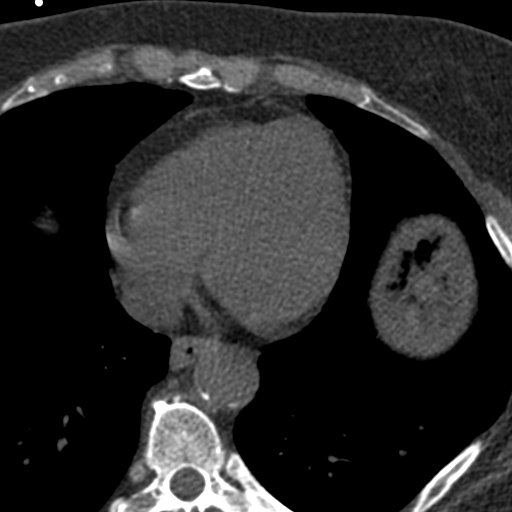
[im 34/68  vessel]
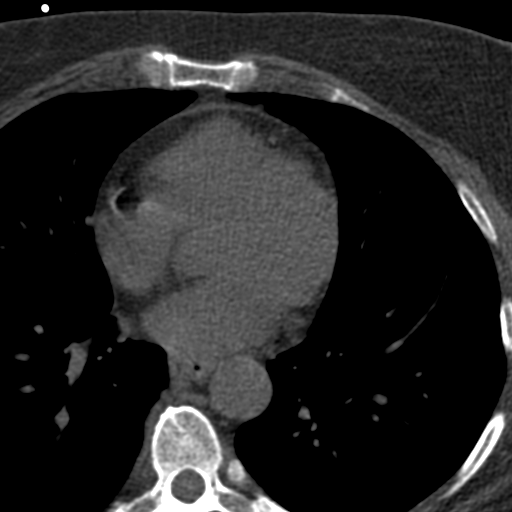
[im 45/68  vessel]
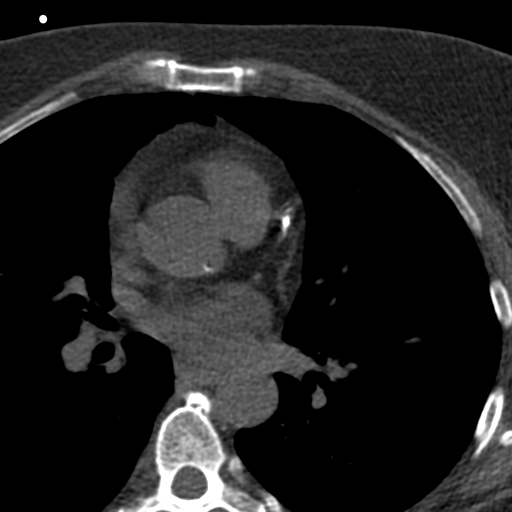
[im 56/68  vessel]
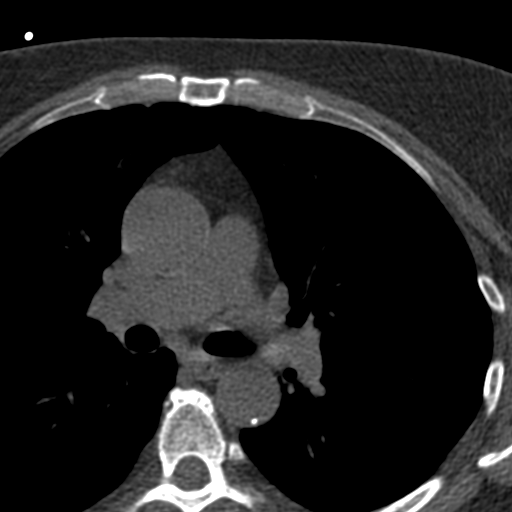

[14 of 20 positions shown; findings below may reference images not displayed]

FINDINGS: Vascular: Aortic atherosclerosis.

Mediastinum/Nodes: No imaged thoracic adenopathy.

Lungs/Pleura: No pleural fluid. 5 mm left upper lobe ground-glass
nodule on [DATE].

Upper Abdomen: Normal imaged portions of the liver, stomach.

Musculoskeletal: No acute osseous abnormality.
IMPRESSION: 1.  No acute findings in the imaged extracardiac chest.
2.  Aortic Atherosclerosis (IQBDV-AM5.5).
3. Left upper lobe 5 mm ground-glass nodule. Per consensus criteria,
this does not warrant imaging follow-up and can be presumed benign.
This recommendation follows the consensus statement: Guidelines for
Management of Incidental Pulmonary Nodules Detected on CT
Images:From the [HOSPITAL] 3173; published online before
print (10.1148/[id number]).
FINDINGS: Non-cardiac: See separate report from [REDACTED].

Ascending aorta: Normal diameter 3.3 cm

Pericardium: Normal

Coronary arteries: Severe 3 vessel coronary calcium
IMPRESSION: Coronary calcium score of 9118. This was 99 th percentile for age
and sex matched control.

Consider f/u perfusion study

Vanek Bush

*** End of Addendum ***
EXAM:
OVER-READ INTERPRETATION  CT CHEST

The following report is an over-read performed by radiologist Dr.
Jing Ayers [REDACTED] on 09/23/2020. This over-read
does not include interpretation of cardiac or coronary anatomy or
pathology. The calcium score interpretation by the cardiologist is
attached.
FINDINGS: Vascular: Aortic atherosclerosis.

Mediastinum/Nodes: No imaged thoracic adenopathy.

Lungs/Pleura: No pleural fluid. 5 mm left upper lobe ground-glass
nodule on [DATE].

Upper Abdomen: Normal imaged portions of the liver, stomach.

Musculoskeletal: No acute osseous abnormality.
IMPRESSION: 1.  No acute findings in the imaged extracardiac chest.
2.  Aortic Atherosclerosis (IQBDV-AM5.5).
3. Left upper lobe 5 mm ground-glass nodule. Per consensus criteria,
this does not warrant imaging follow-up and can be presumed benign.
This recommendation follows the consensus statement: Guidelines for
Management of Incidental Pulmonary Nodules Detected on CT
Images:From the [HOSPITAL] 3173; published online before
print (10.1148/[id number]).

## 2022-05-25 ENCOUNTER — Other Ambulatory Visit: Payer: Self-pay | Admitting: Cardiology

## 2022-06-29 ENCOUNTER — Encounter: Payer: Self-pay | Admitting: Cardiology

## 2022-08-25 ENCOUNTER — Other Ambulatory Visit: Payer: Self-pay | Admitting: Cardiology
